# Patient Record
Sex: Male | Born: 1995 | Race: White | Hispanic: No | Marital: Single | Smoking: Current every day smoker
Health system: Southern US, Community
[De-identification: ages and names within clinical notes are randomized; demographics above are authoritative.]

## PROBLEM LIST (undated history)

## (undated) DIAGNOSIS — F909 Attention-deficit hyperactivity disorder, unspecified type: Secondary | ICD-10-CM

## (undated) DIAGNOSIS — F1910 Other psychoactive substance abuse, uncomplicated: Secondary | ICD-10-CM

## (undated) DIAGNOSIS — F1110 Opioid abuse, uncomplicated: Secondary | ICD-10-CM

## (undated) DIAGNOSIS — R1110 Vomiting, unspecified: Secondary | ICD-10-CM

## (undated) HISTORY — PX: WISDOM TOOTH EXTRACTION: SHX21

## (undated) HISTORY — DX: Attention-deficit hyperactivity disorder, unspecified type: F90.9

## (undated) HISTORY — DX: Vomiting, unspecified: R11.10

---
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
## 2013-01-30 ENCOUNTER — Telehealth: Payer: Self-pay | Admitting: Nurse Practitioner

## 2013-01-30 NOTE — Telephone Encounter (Signed)
appt made

## 2013-02-03 ENCOUNTER — Encounter: Payer: Self-pay | Admitting: Nurse Practitioner

## 2013-02-03 ENCOUNTER — Ambulatory Visit (INDEPENDENT_AMBULATORY_CARE_PROVIDER_SITE_OTHER): Payer: Managed Care, Other (non HMO) | Admitting: Nurse Practitioner

## 2013-02-03 VITALS — BP 133/78 | HR 75 | Temp 97.7°F | Ht 68.5 in | Wt 134.0 lb

## 2013-02-03 DIAGNOSIS — J309 Allergic rhinitis, unspecified: Secondary | ICD-10-CM

## 2013-02-03 DIAGNOSIS — F988 Other specified behavioral and emotional disorders with onset usually occurring in childhood and adolescence: Secondary | ICD-10-CM | POA: Insufficient documentation

## 2013-02-03 MED ORDER — MONTELUKAST SODIUM 10 MG PO TABS: 10.0000 mg | ORAL_TABLET | Freq: Every day | ORAL | Status: DC

## 2013-02-03 MED ORDER — LISDEXAMFETAMINE DIMESYLATE 50 MG PO CAPS: 50.0000 mg | ORAL_CAPSULE | ORAL | Status: DC

## 2013-02-03 NOTE — Progress Notes (Signed)
  Subjective:    Patient ID: Dennis Welch, male    DOB: 1996-05-26, 17 y.o.   MRN: [id number]  HPI Patient dx with ADD. Currently taking Vyvanse 50mg  QD. Patient states no side effects from medication. Behavior at school Good. Grades Good. Dont feel that need change in medications at this time.     Review of Systems  Gastrointestinal: Negative for nausea, diarrhea and constipation.  Psychiatric/Behavioral: Negative for decreased concentration. The patient is not hyperactive.   All other systems reviewed and are negative.       Objective:   Physical Exam  Constitutional: He is oriented to person, place, and time. He appears well-developed and well-nourished.  Cardiovascular: Normal rate, regular rhythm and normal heart sounds.   Pulmonary/Chest: Effort normal and breath sounds normal.  Neurological: He is alert and oriented to person, place, and time.  Psychiatric: He has a normal mood and affect. His behavior is normal. Judgment and thought content normal.   BP 133/78  Pulse 75  Temp(Src) 97.7 F (36.5 C) (Oral)  Ht 5' 8.5" (1.74 m)  Wt 134 lb (60.782 kg)  BMI 20.08 kg/m2        Assessment & Plan:  ADD  Continue to work on behavior  Eat 3 good meals a day  Continue current dose of vyvanse  F/U in 2-3 months Mary-Margaret Daphine Deutscher, FNP

## 2013-02-03 NOTE — Patient Instructions (Signed)
Allergic Rhinitis  Allergic rhinitis is when the mucous membranes in the nose respond to allergens. Allergens are particles in the air that cause your body to have an allergic reaction. This causes you to release allergic antibodies. Through a chain of events, these eventually cause you to release histamine into the blood stream (hence the use of antihistamines). Although meant to be protective to the body, it is this release that causes your discomfort, such as frequent sneezing, congestion and an itchy runny nose.    CAUSES    The pollen allergens may come from grasses, trees, and weeds. This is seasonal allergic rhinitis, or "hay fever." Other allergens cause year-round allergic rhinitis (perennial allergic rhinitis) such as house dust mite allergen, pet dander and mold spores.    SYMPTOMS     Nasal stuffiness (congestion).   Runny, itchy nose with sneezing and tearing of the eyes.   There is often an itching of the mouth, eyes and ears.  It cannot be cured, but it can be controlled with medications.  DIAGNOSIS    If you are unable to determine the offending allergen, skin or blood testing may find it.  TREATMENT     Avoid the allergen.   Medications and allergy shots (immunotherapy) can help.   Hay fever may often be treated with antihistamines in pill or nasal spray forms. Antihistamines block the effects of histamine. There are over-the-counter medicines that may help with nasal congestion and swelling around the eyes. Check with your caregiver before taking or giving this medicine.  If the treatment above does not work, there are many new medications your caregiver can prescribe. Stronger medications may be used if initial measures are ineffective. Desensitizing injections can be used if medications and avoidance fails. Desensitization is when a patient is given ongoing shots until the body becomes less sensitive to the allergen. Make sure you follow up with your caregiver if problems continue.   SEEK MEDICAL CARE IF:     You develop fever (more than 100.5 F (38.1 C).   You develop a cough that does not stop easily (persistent).   You have shortness of breath.   You start wheezing.   Symptoms interfere with normal daily activities.  Document Released: 07/11/2001 Document Revised: 01/08/2012 Document Reviewed: 01/20/2009  Exit[employer] Patient Information 2013 [employer].

## 2013-02-05 ENCOUNTER — Ambulatory Visit: Payer: Self-pay | Admitting: Nurse Practitioner

## 2013-04-07 ENCOUNTER — Telehealth: Payer: Self-pay | Admitting: Nurse Practitioner

## 2013-04-09 ENCOUNTER — Other Ambulatory Visit: Payer: Self-pay | Admitting: *Deleted

## 2013-04-09 MED ORDER — LISDEXAMFETAMINE DIMESYLATE 50 MG PO CAPS: ORAL_CAPSULE | ORAL | Status: DC

## 2013-04-09 NOTE — Telephone Encounter (Signed)
rx ready for pickup 

## 2013-04-09 NOTE — Telephone Encounter (Signed)
LAST RF 03/05/13. LAST OV 4/14.

## 2013-04-09 NOTE — Telephone Encounter (Signed)
Pt aware, rx ready. 

## 2013-04-29 ENCOUNTER — Telehealth: Payer: Self-pay | Admitting: Nurse Practitioner

## 2013-04-29 NOTE — Telephone Encounter (Signed)
appt changed

## 2013-05-08 ENCOUNTER — Ambulatory Visit (INDEPENDENT_AMBULATORY_CARE_PROVIDER_SITE_OTHER): Payer: Managed Care, Other (non HMO) | Admitting: Nurse Practitioner

## 2013-05-08 ENCOUNTER — Encounter: Payer: Self-pay | Admitting: Nurse Practitioner

## 2013-05-08 VITALS — BP 131/77 | HR 72 | Temp 97.4°F | Ht 68.0 in | Wt 131.0 lb

## 2013-05-08 DIAGNOSIS — F988 Other specified behavioral and emotional disorders with onset usually occurring in childhood and adolescence: Secondary | ICD-10-CM

## 2013-05-08 MED ORDER — LISDEXAMFETAMINE DIMESYLATE 50 MG PO CAPS: ORAL_CAPSULE | ORAL | Status: DC

## 2013-05-08 MED ORDER — LISDEXAMFETAMINE DIMESYLATE 50 MG PO CAPS: 50.0000 mg | ORAL_CAPSULE | ORAL | Status: DC

## 2013-05-08 NOTE — Progress Notes (Signed)
  Subjective:    Patient ID: Dennis Welch, male    DOB: 05-31-1996, 17 y.o.   MRN: [id number]  HPI Patient in for follow-up of ADD- Currently on vyvanse 50mg  1 po qd- Doing quite well- Behavior good- concentration good. No c/o side effects    Review of Systems  All other systems reviewed and are negative.       Objective:   Physical Exam  Constitutional: He appears well-developed and well-nourished.  Cardiovascular: Normal rate and normal heart sounds.   Pulmonary/Chest: Effort normal and breath sounds normal.  Psychiatric: He has a normal mood and affect. His behavior is normal. Judgment and thought content normal.    BP 131/77  Pulse 72  Temp(Src) 97.4 F (36.3 C) (Oral)  Ht 5\' 8"  (1.727 m)  Wt 131 lb (59.421 kg)  BMI 19.92 kg/m2        Assessment & Plan:  1. ADD (attention deficit disorder) Behavior modification Exercise Meds ordered this encounter  Medications  . lisdexamfetamine (VYVANSE) 50 MG capsule    Sig: Take 1 capsule (50 mg total) by mouth every morning.    Dispense:  30 capsule    Refill:  0    Order Specific Question:  Supervising Provider    Answer:  Ernestina Penna [[id number]]  . lisdexamfetamine (VYVANSE) 50 MG capsule    Sig: TAKE ONE CAPSULE BY MOUTH EVERY DAY    Dispense:  30 capsule    Refill:  0    DO Not FILL TILL 06/07/13    Order Specific Question:  Supervising Provider    Answer:  Ernestina Penna [[id number]]    Mary-Margaret Daphine Deutscher, FNP

## 2013-05-08 NOTE — Patient Instructions (Signed)
Attention Deficit Hyperactivity Disorder Attention deficit hyperactivity disorder (ADHD) is a problem with behavior issues based on the way the brain functions (neurobehavioral disorder). It is a common reason for behavior and academic problems in school. CAUSES  The cause of ADHD is unknown in most cases. It may run in families. It sometimes can be associated with learning disabilities and other behavioral problems. SYMPTOMS  There are 3 types of ADHD. The 3 types and some of the symptoms include:  Inattentive  Gets bored or distracted easily.  Loses or forgets things. Forgets to hand in homework.  Has trouble organizing or completing tasks.  Difficulty staying on task.  An inability to organize daily tasks and school work.  Leaving projects, chores, or homework unfinished.  Trouble paying attention or responding to details. Careless mistakes.  Difficulty following directions. Often seems like is not listening.  Dislikes activities that require sustained attention (like chores or homework).  Hyperactive-impulsive  Feels like it is impossible to sit still or stay in a seat. Fidgeting with hands and feet.  Trouble waiting turn.  Talking too much or out of turn. Interruptive.  Speaks or acts impulsively.  Aggressive, disruptive behavior.  Constantly busy or on the go, noisy.  Combined  Has symptoms of both of the above. Often children with ADHD feel discouraged about themselves and with school. They often perform well below their abilities in school. These symptoms can cause problems in home, school, and in relationships with peers. As children get older, the excess motor activities can calm down, but the problems with paying attention and staying organized persist. Most children do not outgrow ADHD but with good treatment can learn to cope with the symptoms. DIAGNOSIS  When ADHD is suspected, the diagnosis should be made by professionals trained in ADHD.  Diagnosis will  include:  Ruling out other reasons for the child's behavior.  The caregivers will check with the child's school and check their medical records.  They will talk to teachers and parents.  Behavior rating scales for the child will be filled out by those dealing with the child on a daily basis. A diagnosis is made only after all information has been considered. TREATMENT  Treatment usually includes behavioral treatment often along with medicines. It may include stimulant medicines. The stimulant medicines decrease impulsivity and hyperactivity and increase attention. Other medicines used include antidepressants and certain blood pressure medicines. Most experts agree that treatment for ADHD should address all aspects of the child's functioning. Treatment should not be limited to the use of medicines alone. Treatment should include structured classroom management. The parents must receive education to address rewarding good behavior, discipline, and limit-setting. Tutoring or behavioral therapy or both should be available for the child. If untreated, the disorder can have long-term serious effects into adolescence and adulthood. HOME CARE INSTRUCTIONS   Often with ADHD there is a lot of frustration among the family in dealing with the illness. There is often blame and anger that is not warranted. This is a life long illness. There is no way to prevent ADHD. In many cases, because the problem affects the family as a whole, the entire family may need help. A therapist can help the family find better ways to handle the disruptive behaviors and promote change. If the child is young, most of the therapist's work is with the parents. Parents will learn techniques for coping with and improving their child's behavior. Sometimes only the child with the ADHD needs counseling. Your caregivers can help   you make these decisions.  Children with ADHD may need help in organizing. Some helpful tips include:  Keep  routines the same every day from wake-up time to bedtime. Schedule everything. This includes homework and playtime. This should include outdoor and indoor recreation. Keep the schedule on the refrigerator or a bulletin board where it is frequently seen. Mark schedule changes as far in advance as possible.  Have a place for everything and keep everything in its place. This includes clothing, backpacks, and school supplies.  Encourage writing down assignments and bringing home needed books.  Offer your child a well-balanced diet. Breakfast is especially important for school performance. Children should avoid drinks with caffeine including:  Soft drinks.  Coffee.  Tea.  However, some older children (adolescents) may find these drinks helpful in improving their attention.  Children with ADHD need consistent rules that they can understand and follow. If rules are followed, give small rewards. Children with ADHD often receive, and expect, criticism. Look for good behavior and praise it. Set realistic goals. Give clear instructions. Look for activities that can foster success and self-esteem. Make time for pleasant activities with your child. Give lots of affection.  Parents are their children's greatest advocates. Learn as much as possible about ADHD. This helps you become a stronger and better advocate for your child. It also helps you educate your child's teachers and instructors if they feel inadequate in these areas. Parent support groups are often helpful. A national group with local chapters is called CHADD (Children and Adults with Attention Deficit Hyperactivity Disorder). PROGNOSIS  There is no cure for ADHD. Children with the disorder seldom outgrow it. Many find adaptive ways to accommodate the ADHD as they mature. SEEK MEDICAL CARE IF:  Your child has repeated muscle twitches, cough or speech outbursts.  Your child has sleep problems.  Your child has a marked loss of  appetite.  Your child develops depression.  Your child has new or worsening behavioral problems.  Your child develops dizziness.  Your child has a racing heart.  Your child has stomach pains.  Your child develops headaches. Document Released: 10/06/2002 Document Revised: 01/08/2012 Document Reviewed: 05/18/2008 Exit[employer] Patient Information 2014 [employer].  

## 2013-05-14 ENCOUNTER — Ambulatory Visit: Payer: Self-pay | Admitting: Nurse Practitioner

## 2013-07-04 ENCOUNTER — Telehealth: Payer: Self-pay | Admitting: Nurse Practitioner

## 2013-07-04 NOTE — Telephone Encounter (Signed)
appt made for monday

## 2013-07-10 ENCOUNTER — Ambulatory Visit: Payer: Managed Care, Other (non HMO) | Admitting: Family Medicine

## 2013-07-14 ENCOUNTER — Telehealth: Payer: Self-pay | Admitting: Nurse Practitioner

## 2013-07-15 MED ORDER — LISDEXAMFETAMINE DIMESYLATE 50 MG PO CAPS: 50.0000 mg | ORAL_CAPSULE | ORAL | Status: DC

## 2013-07-15 NOTE — Telephone Encounter (Signed)
r ready for pick up 

## 2013-07-16 NOTE — Telephone Encounter (Signed)
Up front 

## 2013-08-20 ENCOUNTER — Other Ambulatory Visit: Payer: Self-pay | Admitting: Nurse Practitioner

## 2013-08-21 MED ORDER — LISDEXAMFETAMINE DIMESYLATE 50 MG PO CAPS: 50.0000 mg | ORAL_CAPSULE | ORAL | Status: DC

## 2013-08-21 NOTE — Telephone Encounter (Signed)
rx ready for pickup 

## 2013-08-21 NOTE — Telephone Encounter (Signed)
Last filled 07/15/13, last seen 05/08/13

## 2013-08-22 NOTE — Telephone Encounter (Signed)
Called patient to pick up

## 2013-09-04 ENCOUNTER — Ambulatory Visit: Payer: Managed Care, Other (non HMO) | Admitting: Nurse Practitioner

## 2013-09-05 ENCOUNTER — Ambulatory Visit: Payer: Managed Care, Other (non HMO) | Admitting: Nurse Practitioner

## 2013-10-01 ENCOUNTER — Ambulatory Visit (INDEPENDENT_AMBULATORY_CARE_PROVIDER_SITE_OTHER): Payer: Managed Care, Other (non HMO) | Admitting: Nurse Practitioner

## 2013-10-01 ENCOUNTER — Other Ambulatory Visit: Payer: Self-pay | Admitting: Nurse Practitioner

## 2013-10-01 MED ORDER — LISDEXAMFETAMINE DIMESYLATE 50 MG PO CAPS: ORAL_CAPSULE | ORAL | Status: DC

## 2013-11-10 ENCOUNTER — Telehealth: Payer: Self-pay | Admitting: Nurse Practitioner

## 2013-11-10 ENCOUNTER — Ambulatory Visit: Payer: Managed Care, Other (non HMO) | Admitting: Nurse Practitioner

## 2013-11-10 NOTE — Telephone Encounter (Signed)
Appt changed to earlier in the day per mothers request

## 2013-11-11 ENCOUNTER — Telehealth: Payer: Self-pay | Admitting: Nurse Practitioner

## 2013-11-11 MED ORDER — LISDEXAMFETAMINE DIMESYLATE 50 MG PO CAPS: 50.0000 mg | ORAL_CAPSULE | ORAL | Status: DC

## 2013-11-11 NOTE — Telephone Encounter (Signed)
rx ready to be picked up

## 2013-11-12 ENCOUNTER — Ambulatory Visit: Payer: Managed Care, Other (non HMO) | Admitting: Nurse Practitioner

## 2013-11-12 NOTE — Telephone Encounter (Signed)
Rx for vyvance ready.

## 2013-12-02 ENCOUNTER — Ambulatory Visit: Payer: Managed Care, Other (non HMO) | Admitting: Nurse Practitioner

## 2013-12-18 ENCOUNTER — Telehealth: Payer: Self-pay | Admitting: Nurse Practitioner

## 2013-12-18 MED ORDER — LISDEXAMFETAMINE DIMESYLATE 50 MG PO CAPS: 50.0000 mg | ORAL_CAPSULE | ORAL | Status: DC

## 2013-12-18 NOTE — Telephone Encounter (Signed)
rx ready for pickup 

## 2013-12-19 NOTE — Telephone Encounter (Signed)
Patient aware.

## 2014-01-07 ENCOUNTER — Ambulatory Visit: Payer: Managed Care, Other (non HMO) | Admitting: Nurse Practitioner

## 2014-01-19 ENCOUNTER — Other Ambulatory Visit: Payer: Self-pay | Admitting: Nurse Practitioner

## 2014-01-19 MED ORDER — LISDEXAMFETAMINE DIMESYLATE 50 MG PO CAPS: 50.0000 mg | ORAL_CAPSULE | ORAL | Status: DC

## 2014-01-19 NOTE — Telephone Encounter (Signed)
rx ready for pickup 

## 2014-01-22 ENCOUNTER — Telehealth: Payer: Self-pay | Admitting: Nurse Practitioner

## 2014-01-22 MED ORDER — LISDEXAMFETAMINE DIMESYLATE 50 MG PO CAPS: ORAL_CAPSULE | ORAL | Status: DC

## 2014-01-22 NOTE — Telephone Encounter (Signed)
rx ready for pick up [facility] for future refills 

## 2014-01-22 NOTE — Telephone Encounter (Signed)
Mom aware to pick up rx

## 2014-02-18 ENCOUNTER — Ambulatory Visit: Payer: Managed Care, Other (non HMO) | Admitting: Nurse Practitioner

## 2014-09-14 ENCOUNTER — Ambulatory Visit (INDEPENDENT_AMBULATORY_CARE_PROVIDER_SITE_OTHER): Payer: BC Managed Care – PPO | Admitting: Nurse Practitioner

## 2014-09-14 ENCOUNTER — Encounter: Payer: Self-pay | Admitting: Nurse Practitioner

## 2014-09-14 ENCOUNTER — Ambulatory Visit (INDEPENDENT_AMBULATORY_CARE_PROVIDER_SITE_OTHER): Payer: BC Managed Care – PPO | Admitting: *Deleted

## 2014-09-14 VITALS — BP 156/94 | HR 108 | Temp 97.1°F | Ht 68.0 in | Wt 132.0 lb

## 2014-09-14 DIAGNOSIS — F988 Other specified behavioral and emotional disorders with onset usually occurring in childhood and adolescence: Secondary | ICD-10-CM

## 2014-09-14 DIAGNOSIS — F909 Attention-deficit hyperactivity disorder, unspecified type: Secondary | ICD-10-CM

## 2014-09-14 DIAGNOSIS — Z23 Encounter for immunization: Secondary | ICD-10-CM

## 2014-09-14 MED ORDER — LISDEXAMFETAMINE DIMESYLATE 50 MG PO CAPS: ORAL_CAPSULE | ORAL | Status: DC

## 2014-09-14 MED ORDER — LISDEXAMFETAMINE DIMESYLATE 50 MG PO CAPS: 50.0000 mg | ORAL_CAPSULE | ORAL | Status: DC

## 2014-09-14 NOTE — Patient Instructions (Signed)

## 2014-09-14 NOTE — Progress Notes (Signed)
  Subjective:    Patient ID: Dennis Welch, male    DOB: 04/17/1996, 18 y.o.   MRN: [id number]  HPI  Patient in for follow-up of ADD- Currently on vyvanse 50mg  1 po qd- Doing quite well- Behavior good- concentration good. No c/o side effects    Review of Systems  All other systems reviewed and are negative.      Objective:   Physical Exam  Constitutional: He is oriented to person, place, and time. He appears well-developed and well-nourished.  HENT:  Head: Normocephalic and atraumatic.  Right Ear: External ear normal.  Left Ear: External ear normal.  Bilateral ear moderate cerumen .   Eyes: Conjunctivae are normal. Pupils are equal, round, and reactive to light.  Neck: Normal range of motion.  Cardiovascular: Normal rate, regular rhythm, normal heart sounds and intact distal pulses.  Exam reveals no gallop and no friction rub.   No murmur heard. Pulmonary/Chest: Effort normal and breath sounds normal. No respiratory distress. He has no wheezes. He has no rales. He exhibits no tenderness.  Abdominal: Soft.  Musculoskeletal: Normal range of motion.  Neurological: He is alert and oriented to person, place, and time. He has normal reflexes.  Skin: Skin is warm.  Psychiatric: He has a normal mood and affect. His behavior is normal. Judgment and thought content normal.    BP 156/94 mmHg  Pulse 108  Temp(Src) 97.1 F (36.2 C) (Oral)  Ht 5\' 8"  (1.727 m)  Wt 132 lb (59.875 kg)  BMI 20.08 kg/m2        Assessment & Plan:   1. ADD (attention deficit disorder)    Meds ordered this encounter  Medications  . lisdexamfetamine (VYVANSE) 50 MG capsule    Sig: Take 1 capsule (50 mg total) by mouth every morning.    Dispense:  30 capsule    Refill:  0    Order Specific Question:  Supervising Provider    Answer:  Ernestina PennaMOORE, DONALD W [[id number]]  . lisdexamfetamine (VYVANSE) 50 MG capsule    Sig: TAKE ONE CAPSULE BY MOUTH EVERY DAY    Dispense:  30 capsule    Refill:  0    Do not fill  till 10/13/14    Order Specific Question:  Supervising Provider    Answer:  Ernestina PennaMOORE, DONALD W [[id number]]   Meds as prescribed Follow-up for recheck in 2 months  Dennis Daphine DeutscherMartin, FNP

## 2015-02-02 ENCOUNTER — Telehealth: Payer: Self-pay | Admitting: Nurse Practitioner

## 2015-02-02 DIAGNOSIS — F988 Other specified behavioral and emotional disorders with onset usually occurring in childhood and adolescence: Secondary | ICD-10-CM

## 2015-02-02 MED ORDER — LISDEXAMFETAMINE DIMESYLATE 50 MG PO CAPS: 50.0000 mg | ORAL_CAPSULE | ORAL | Status: DC

## 2015-02-02 NOTE — Telephone Encounter (Signed)
vyvanse rx ready for pick up  

## 2015-02-02 NOTE — Telephone Encounter (Signed)
Written Rx at front office to be picked up

## 2015-02-17 ENCOUNTER — Ambulatory Visit (INDEPENDENT_AMBULATORY_CARE_PROVIDER_SITE_OTHER): Payer: BLUE CROSS/BLUE SHIELD | Admitting: Family

## 2015-02-17 ENCOUNTER — Encounter: Payer: Self-pay | Admitting: Family

## 2015-02-17 VITALS — BP 144/87 | HR 96 | Temp 98.0°F | Ht 68.0 in | Wt 132.8 lb

## 2015-02-17 DIAGNOSIS — A084 Viral intestinal infection, unspecified: Secondary | ICD-10-CM

## 2015-02-17 DIAGNOSIS — R110 Nausea: Secondary | ICD-10-CM | POA: Diagnosis not present

## 2015-02-17 LAB — POCT INFLUENZA A/B
INFLUENZA A, POC: NEGATIVE
INFLUENZA B, POC: NEGATIVE

## 2015-02-17 NOTE — Progress Notes (Signed)
   Subjective:    Patient ID: Dennis Welch, male    DOB: 07/27/1996, 19 y.o.   MRN: [id number]  Emesis  This is a new problem. The current episode started in the past 7 days (Sunday). The problem occurs less than 2 times per day. The problem has been waxing and waning. The emesis has an appearance of stomach contents. Maximum temperature: Pt has been taking Motrin. Associated symptoms include chills, dizziness, headaches and myalgias. Pertinent negatives include no coughing or diarrhea. He has tried acetaminophen and bed rest for the symptoms. The treatment provided mild relief.  Headache  Associated symptoms include dizziness and vomiting. Pertinent negatives include no coughing.      Review of Systems  Constitutional: Positive for chills.  Respiratory: Negative.  Negative for cough.   Cardiovascular: Negative.   Gastrointestinal: Positive for vomiting. Negative for diarrhea.  Endocrine: Negative.   Genitourinary: Negative.   Musculoskeletal: Positive for myalgias.  Neurological: Positive for dizziness and headaches.  Hematological: Negative.   Psychiatric/Behavioral: Negative.   All other systems reviewed and are negative.      Objective:   Physical Exam  Constitutional: He is oriented to person, place, and time. He appears well-developed and well-nourished. No distress.  HENT:  Head: Normocephalic.  Right Ear: External ear normal.  Left Ear: External ear normal.  Mouth/Throat: Oropharynx is clear and moist.  Nasal passage erythemas with mild swelling    Eyes: Pupils are equal, round, and reactive to light. Right eye exhibits no discharge. Left eye exhibits no discharge.  Neck: Normal range of motion. Neck supple. No thyromegaly present.  Cardiovascular: Normal rate, regular rhythm, normal heart sounds and intact distal pulses.   No murmur heard. Pulmonary/Chest: Effort normal. No respiratory distress. He has wheezes.  Abdominal: Soft. Bowel sounds are normal. He  exhibits no distension. There is no tenderness.  Musculoskeletal: Normal range of motion. He exhibits no edema or tenderness.  Neurological: He is alert and oriented to person, place, and time. He has normal reflexes. No cranial nerve deficit.  Skin: Skin is warm and dry. No rash noted. No erythema.  Psychiatric: He has a normal mood and affect. His behavior is normal. Judgment and thought content normal.  Vitals reviewed.     BP 144/87 mmHg  Pulse 96  Temp(Src) 98 F (36.7 C) (Oral)  Ht 5\' 8"  (1.727 m)  Wt 132 lb 12.8 oz (60.238 kg)  BMI 20.20 kg/m2     Assessment & Plan:  1. Nausea - POCT Influenza A/B  2. Viral gastroenteritis -Force fluids -Bland diet -Rest -Tylenol prn for fever and aches -RTO prn  Jannifer Rodneyhristy Helem Reesor, FNP

## 2015-02-17 NOTE — Patient Instructions (Signed)

## 2015-02-22 ENCOUNTER — Encounter: Payer: Self-pay | Admitting: Nurse Practitioner

## 2015-02-22 ENCOUNTER — Ambulatory Visit (INDEPENDENT_AMBULATORY_CARE_PROVIDER_SITE_OTHER): Payer: BLUE CROSS/BLUE SHIELD | Admitting: Nurse Practitioner

## 2015-02-22 VITALS — BP 138/86 | HR 93 | Temp 98.6°F | Ht 68.0 in | Wt 132.0 lb

## 2015-02-22 DIAGNOSIS — F909 Attention-deficit hyperactivity disorder, unspecified type: Secondary | ICD-10-CM

## 2015-02-22 DIAGNOSIS — F988 Other specified behavioral and emotional disorders with onset usually occurring in childhood and adolescence: Secondary | ICD-10-CM

## 2015-02-22 MED ORDER — LISDEXAMFETAMINE DIMESYLATE 50 MG PO CAPS: ORAL_CAPSULE | ORAL | Status: DC

## 2015-02-22 MED ORDER — LISDEXAMFETAMINE DIMESYLATE 50 MG PO CAPS: 50.0000 mg | ORAL_CAPSULE | ORAL | Status: DC

## 2015-02-22 NOTE — Progress Notes (Signed)
   Subjective:    Patient ID: Dennis Welch, male    DOB: 04/05/1996, 19 y.o.   MRN: [id number]  HPI Patient brought in today by self for follow up of ADD. Currently taking vyvanse 50 mg daily. Behavior- good Grades- currently not i school Medication side effects- none Weight loss- none Sleeping habits- good Any concerns none     Review of Systems  Constitutional: Negative.   HENT: Negative.   Respiratory: Negative.   Cardiovascular: Negative.   Gastrointestinal: Negative.   Genitourinary: Negative.   Neurological: Negative.   Psychiatric/Behavioral: Negative.   All other systems reviewed and are negative.      Objective:   Physical Exam  Constitutional: He is oriented to person, place, and time. He appears well-developed and well-nourished.  Cardiovascular: Normal rate and regular rhythm.   Murmur (1/6 systolic) heard. Pulmonary/Chest: Effort normal and breath sounds normal.  Neurological: He is alert and oriented to person, place, and time.  Skin: Skin is warm and dry.  Psychiatric: He has a normal mood and affect. His behavior is normal. Judgment and thought content normal.    BP 138/86 mmHg  Pulse 93  Temp(Src) 98.6 F (37 C) (Oral)  Ht 5\' 8"  (1.727 m)  Wt 132 lb (59.875 kg)  BMI 20.08 kg/m2        Assessment & Plan:   1. ADD (attention deficit disorder)    Meds ordered this encounter  Medications  . lisdexamfetamine (VYVANSE) 50 MG capsule    Sig: Take 1 capsule (50 mg total) by mouth every morning.    Dispense:  30 capsule    Refill:  0    DO NOT FILL TILL 03/23/15    Order Specific Question:  Supervising Provider    Answer:  Ernestina PennaMOORE, DONALD W [[id number]]  . lisdexamfetamine (VYVANSE) 50 MG capsule    Sig: TAKE ONE CAPSULE BY MOUTH EVERY DAY    Dispense:  30 capsule    Refill:  0    Order Specific Question:  Supervising Provider    Answer:  Ernestina PennaMOORE, DONALD W [[id number]]  . lisdexamfetamine (VYVANSE) 50 MG capsule    Sig: Take 1 capsule (50 mg total) by  mouth every morning.    Dispense:  30 capsule    Refill:  0    DO NOT FILL TILL 6/23/ 16    Order Specific Question:  Supervising Provider    Answer:  Ernestina PennaMOORE, DONALD W [[id number]]   Stress management Follow up in 3 months  Dennis Daphine DeutscherMartin, FNP

## 2015-04-06 ENCOUNTER — Encounter: Payer: Self-pay | Admitting: Family

## 2015-04-06 ENCOUNTER — Ambulatory Visit (INDEPENDENT_AMBULATORY_CARE_PROVIDER_SITE_OTHER): Payer: BLUE CROSS/BLUE SHIELD | Admitting: Family

## 2015-04-06 VITALS — BP 137/81 | HR 88 | Temp 98.2°F | Ht 68.0 in | Wt 129.4 lb

## 2015-04-06 DIAGNOSIS — T148 Other injury of unspecified body region: Secondary | ICD-10-CM

## 2015-04-06 DIAGNOSIS — T148XXA Other injury of unspecified body region, initial encounter: Secondary | ICD-10-CM
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
## 2015-04-06 NOTE — Patient Instructions (Signed)

## 2015-05-31 ENCOUNTER — Telehealth: Payer: Self-pay | Admitting: Nurse Practitioner

## 2015-05-31 DIAGNOSIS — F988 Other specified behavioral and emotional disorders with onset usually occurring in childhood and adolescence: Secondary | ICD-10-CM

## 2015-05-31 MED ORDER — LISDEXAMFETAMINE DIMESYLATE 50 MG PO CAPS: 50.0000 mg | ORAL_CAPSULE | ORAL | Status: DC

## 2015-05-31 NOTE — Telephone Encounter (Signed)
No working number on file. Rx left up front and ready for pick up

## 2015-05-31 NOTE — Telephone Encounter (Signed)
vyvanse rx ready for pick up  

## 2015-06-18 ENCOUNTER — Ambulatory Visit: Payer: BLUE CROSS/BLUE SHIELD | Admitting: Nurse Practitioner

## 2015-07-23 ENCOUNTER — Telehealth: Payer: Self-pay | Admitting: Nurse Practitioner

## 2015-07-23 NOTE — Telephone Encounter (Signed)
Question answered. 

## 2015-07-24 NOTE — Telephone Encounter (Signed)
Pt was given an appt 08/02/15 with MMM.

## 2015-07-30 ENCOUNTER — Telehealth: Payer: Self-pay | Admitting: Nurse Practitioner

## 2015-08-02 ENCOUNTER — Ambulatory Visit: Payer: BLUE CROSS/BLUE SHIELD | Admitting: Nurse Practitioner

## 2015-08-02 ENCOUNTER — Encounter (HOSPITAL_COMMUNITY): Payer: Self-pay | Admitting: Emergency Medicine

## 2015-08-02 ENCOUNTER — Emergency Department (HOSPITAL_COMMUNITY): Payer: BLUE CROSS/BLUE SHIELD

## 2015-08-02 ENCOUNTER — Inpatient Hospital Stay (HOSPITAL_COMMUNITY)
Admission: EM | Admit: 2015-08-02 | Discharge: 2015-08-04 | DRG: 917 | Disposition: A | Discharge status: Home / Self Care | Payer: BLUE CROSS/BLUE SHIELD | Attending: Internal Medicine | Admitting: Internal Medicine

## 2015-08-02 ENCOUNTER — Ambulatory Visit: Payer: BLUE CROSS/BLUE SHIELD | Admitting: Family Medicine

## 2015-08-02 DIAGNOSIS — T50904A Poisoning by unspecified drugs, medicaments and biological substances, undetermined, initial encounter: Secondary | ICD-10-CM

## 2015-08-02 DIAGNOSIS — T401X1A Poisoning by heroin, accidental (unintentional), initial encounter: Principal | ICD-10-CM | POA: Diagnosis present

## 2015-08-02 DIAGNOSIS — Y92012 Bathroom of single-family (private) house as the place of occurrence of the external cause: Secondary | ICD-10-CM

## 2015-08-02 DIAGNOSIS — F988 Other specified behavioral and emotional disorders with onset usually occurring in childhood and adolescence: Secondary | ICD-10-CM | POA: Diagnosis present

## 2015-08-02 DIAGNOSIS — E876 Hypokalemia: Secondary | ICD-10-CM | POA: Diagnosis present

## 2015-08-02 DIAGNOSIS — Z781 Physical restraint status: Secondary | ICD-10-CM

## 2015-08-02 DIAGNOSIS — F1110 Opioid abuse, uncomplicated: Secondary | ICD-10-CM | POA: Diagnosis present

## 2015-08-02 DIAGNOSIS — F909 Attention-deficit hyperactivity disorder, unspecified type: Secondary | ICD-10-CM | POA: Diagnosis not present

## 2015-08-02 DIAGNOSIS — R4182 Altered mental status, unspecified: Secondary | ICD-10-CM | POA: Diagnosis not present

## 2015-08-02 DIAGNOSIS — T50901A Poisoning by unspecified drugs, medicaments and biological substances, accidental (unintentional), initial encounter: Secondary | ICD-10-CM | POA: Diagnosis present

## 2015-08-02 DIAGNOSIS — F901 Attention-deficit hyperactivity disorder, predominantly hyperactive type: Secondary | ICD-10-CM | POA: Diagnosis present

## 2015-08-02 DIAGNOSIS — J690 Pneumonitis due to inhalation of food and vomit: Secondary | ICD-10-CM

## 2015-08-02 DIAGNOSIS — T401X2D Poisoning by heroin, intentional self-harm, subsequent encounter: Secondary | ICD-10-CM

## 2015-08-02 DIAGNOSIS — R451 Restlessness and agitation: Secondary | ICD-10-CM | POA: Insufficient documentation

## 2015-08-02 LAB — CBC WITH DIFFERENTIAL/PLATELET
BASOS ABS: 0 10*3/uL (ref 0.0–0.1)
Basophils Relative: 0 %
Eosinophils Absolute: 0.2 10*3/uL (ref 0.0–0.7)
Eosinophils Relative: 1 %
HEMATOCRIT: 42.9 % (ref 39.0–52.0)
HEMOGLOBIN: 15.1 g/dL (ref 13.0–17.0)
Lymphocytes Relative: 7 %
Lymphs Abs: 1.3 10*3/uL (ref 0.7–4.0)
MCH: 31.6 pg (ref 26.0–34.0)
MCHC: 35.2 g/dL (ref 30.0–36.0)
MCV: 89.7 fL (ref 78.0–100.0)
MONO ABS: 1.2 10*3/uL — AB (ref 0.1–1.0)
MONOS PCT: 7 %
Neutro Abs: 16.1 10*3/uL — ABNORMAL HIGH (ref 1.7–7.7)
Neutrophils Relative %: 85 %
PLATELETS: 246 10*3/uL (ref 150–400)
RBC: 4.78 MIL/uL (ref 4.22–5.81)
RDW: 13.5 % (ref 11.5–15.5)
WBC: 18.8 10*3/uL — ABNORMAL HIGH (ref 4.0–10.5)

## 2015-08-02 LAB — BLOOD GAS, ARTERIAL
ACID-BASE EXCESS: 4.8 mmol/L — AB (ref 0.0–2.0)
BICARBONATE: 20.3 meq/L (ref 20.0–24.0)
Drawn by: 22223
O2 CONTENT: 4 L/min
O2 Saturation: 99.5 %
PATIENT TEMPERATURE: 37
PCO2 ART: 40.6 mmHg (ref 35.0–45.0)
PO2 ART: 209 mmHg — AB (ref 80.0–100.0)
TCO2: 19.4 mmol/L (ref 0–100)
pH, Arterial: 7.318 — ABNORMAL LOW (ref 7.350–7.450)

## 2015-08-02 LAB — URINALYSIS, ROUTINE W REFLEX MICROSCOPIC
Bilirubin Urine: NEGATIVE
Glucose, UA: NEGATIVE mg/dL
Hgb urine dipstick: NEGATIVE
KETONES UR: NEGATIVE mg/dL
LEUKOCYTES UA: NEGATIVE
NITRITE: NEGATIVE
PH: 6 (ref 5.0–8.0)
PROTEIN: NEGATIVE mg/dL
Specific Gravity, Urine: 1.025 (ref 1.005–1.030)
Urobilinogen, UA: 0.2 mg/dL (ref 0.0–1.0)

## 2015-08-02 LAB — RAPID URINE DRUG SCREEN, HOSP PERFORMED
AMPHETAMINES: NOT DETECTED
BARBITURATES: NOT DETECTED
BENZODIAZEPINES: POSITIVE — AB
Cocaine: NOT DETECTED
Opiates: POSITIVE — AB
TETRAHYDROCANNABINOL: POSITIVE — AB

## 2015-08-02 LAB — COMPREHENSIVE METABOLIC PANEL
ALBUMIN: 4.2 g/dL (ref 3.5–5.0)
ALK PHOS: 65 U/L (ref 38–126)
ALT: 19 U/L (ref 17–63)
ANION GAP: 14 (ref 5–15)
AST: 50 U/L — ABNORMAL HIGH (ref 15–41)
BILIRUBIN TOTAL: 0.4 mg/dL (ref 0.3–1.2)
BUN: 6 mg/dL (ref 6–20)
CALCIUM: 8.1 mg/dL — AB (ref 8.9–10.3)
CO2: 19 mmol/L — ABNORMAL LOW (ref 22–32)
CREATININE: 1 mg/dL (ref 0.61–1.24)
Chloride: 104 mmol/L (ref 101–111)
GFR calc Af Amer: >60 mL/min (ref >60)
GFR calc non Af Amer: >60 mL/min (ref >60)
GLUCOSE: 115 mg/dL — AB (ref 65–99)
Potassium: 3.2 mmol/L — ABNORMAL LOW (ref 3.5–5.1)
Sodium: 137 mmol/L (ref 135–145)
TOTAL PROTEIN: 7.4 g/dL (ref 6.5–8.1)

## 2015-08-02 LAB — PROCALCITONIN: Procalcitonin: <0.1 ng/mL

## 2015-08-02 LAB — ETHANOL: ALCOHOL ETHYL (B): 9 mg/dL — AB (ref <5)

## 2015-08-02 LAB — MAGNESIUM: Magnesium: 1.8 mg/dL (ref 1.7–2.4)

## 2015-08-02 LAB — I-STAT CG4 LACTIC ACID, ED: Lactic Acid, Venous: 5.51 mmol/L (ref 0.5–2.0)

## 2015-08-02 LAB — LACTIC ACID, PLASMA: LACTIC ACID, VENOUS: 4 mmol/L — AB (ref 0.5–2.0)

## 2015-08-02 LAB — LIPASE, BLOOD: Lipase: 27 U/L (ref 22–51)

## 2015-08-02 LAB — ACETAMINOPHEN LEVEL: Acetaminophen (Tylenol), Serum: <10 ug/mL — ABNORMAL LOW (ref 10–30)

## 2015-08-02 MED ORDER — SODIUM CHLORIDE 0.9 % IV BOLUS (SEPSIS)
1000.0000 mL | Freq: Once | INTRAVENOUS | Status: AC
  Administered 2015-08-02: 1000 mL via INTRAVENOUS

## 2015-08-02 MED ORDER — LORAZEPAM 2 MG/ML IJ SOLN
2.0000 mg | Freq: Once | INTRAMUSCULAR | Status: AC
  Administered 2015-08-02: 2 mg via INTRAVENOUS
  Filled 2015-08-02: qty 1

## 2015-08-02 MED ORDER — ONDANSETRON HCL 4 MG/2ML IJ SOLN
4.0000 mg | Freq: Once | INTRAMUSCULAR | Status: AC
  Administered 2015-08-02: 4 mg via INTRAVENOUS
  Filled 2015-08-02: qty 2

## 2015-08-02 MED ORDER — PIPERACILLIN-TAZOBACTAM 3.375 G IVPB
3.3750 g | Freq: Three times a day (TID) | INTRAVENOUS | Status: DC
  Administered 2015-08-03 – 2015-08-04 (×4): 3.375 g via INTRAVENOUS
  Filled 2015-08-02 (×8): qty 50

## 2015-08-02 MED ORDER — LORAZEPAM 2 MG/ML IJ SOLN
INTRAMUSCULAR | Status: AC
Start: 2015-08-02 — End: 2015-08-02
  Administered 2015-08-02: 1 mg
  Filled 2015-08-02: qty 1

## 2015-08-02 MED ORDER — VANCOMYCIN HCL IN DEXTROSE 1-5 GM/200ML-% IV SOLN
1000.0000 mg | Freq: Once | INTRAVENOUS | Status: AC
Start: 2015-08-02 — End: 2015-08-02
  Administered 2015-08-02: 1000 mg via INTRAVENOUS
  Filled 2015-08-02: qty 200

## 2015-08-02 MED ORDER — VANCOMYCIN HCL 500 MG IV SOLR
500.0000 mg | Freq: Three times a day (TID) | INTRAVENOUS | Status: DC
  Administered 2015-08-03 – 2015-08-04 (×4): 500 mg via INTRAVENOUS
  Filled 2015-08-02 (×8): qty 500

## 2015-08-02 MED ORDER — SODIUM CHLORIDE 0.9 % IV BOLUS (SEPSIS): 1000.0000 mL | INTRAVENOUS | Status: AC

## 2015-08-02 MED ORDER — NALOXONE HCL 1 MG/ML IJ SOLN
1.0000 mg/h | INTRAVENOUS | Status: DC
  Administered 2015-08-02: 1 mg/h via INTRAVENOUS
  Filled 2015-08-02: qty 4

## 2015-08-02 MED ORDER — SODIUM CHLORIDE 0.9 % IV SOLN: INTRAVENOUS | Status: DC

## 2015-08-02 MED ORDER — NALOXONE HCL 1 MG/ML IJ SOLN
INTRAMUSCULAR | Status: AC
  Filled 2015-08-02: qty 4

## 2015-08-02 MED ORDER — PIPERACILLIN-TAZOBACTAM 3.375 G IVPB 30 MIN
3.3750 g | Freq: Once | INTRAVENOUS | Status: AC
  Administered 2015-08-02: 3.375 g via INTRAVENOUS
  Filled 2015-08-02: qty 50
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
## 2015-08-02 NOTE — Progress Notes (Signed)
ANTIBIOTIC CONSULT NOTE - INITIAL  Pharmacy Consult for vancomycin and zosyn Indication: rule out sepsis  Allergies  Allergen Reactions  . Sulfa Antibiotics     Patient Measurements: Height:  (172.7 cm) Weight: 129 lb (58.514 kg) IBW/kg (Calculated) : 68.4   Vital Signs: BP: 141/91 mmHg (10/03 2000) Pulse Rate: 104 (10/03 2000) Intake/Output from previous day:   Intake/Output from this shift:    Labs:  Recent Labs  08/02/15 2018  WBC 18.8*  HGB 15.1  PLT 246  CREATININE 1.00   Estimated Creatinine Clearance: 96.7 mL/min (by C-G formula based on Cr of 1). No results for input(s): VANCOTROUGH, VANCOPEAK, VANCORANDOM, GENTTROUGH, GENTPEAK, GENTRANDOM, TOBRATROUGH, TOBRAPEAK, TOBRARND, AMIKACINPEAK, AMIKACINTROU, AMIKACIN in the last 72 hours.   Microbiology: No results found for this or any previous visit (from the past 720 hour(s)).  Medical History: Past Medical History  Diagnosis Date  . ADD (attention deficit disorder with hyperactivity)   . Vomiting     Medications:  See medication history Assessment: 19 yo man found unconscious by mother.  He will start broad spectrum antibiotics for r/o sepsis, r/o asp PNA.  First doses of antibiotics ordered in the ED    Goal of Therapy:  Vancomycin trough level 15-20 mcg/ml  Plan:  Cont zosyn 3.375 gm IV q8 hours Cont vancomycin 500 mg IV q8 hours F/u renal function, cultures and clinical course Check vanc trough at [facility]  Thanks for allowing pharmacy to be a part of this patient's care.  Talbert Cage, PharmD Clinical Pharmacist  08/02/2015,9:18 PM

## 2015-08-02 NOTE — ED Notes (Signed)
CRITICAL VALUE ALERT  Critical value received:  Lactic acid 4.0  Date of notification:  08/02/2015  Time of notification:  2205  Critical value read back:Yes.    Nurse who received alert:  Bronson Curb, RN  MD notified (1st page):  Dr Deretha Emory  Time of first page:  2205  MD notified (2nd page):  Time of second page:  Responding MD:  Dr Deretha Emory  Time MD responded:  2205

## 2015-08-02 NOTE — ED Notes (Signed)
Patient cursing at staff  And attempting to get out of bed

## 2015-08-02 NOTE — ED Provider Notes (Signed)
CSN: [id number]     Arrival date & time 08/02/15  1948 History   First MD Initiated Contact with Patient 08/02/15 1950     Chief Complaint  Patient presents with  . Drug Overdose     (Consider location/radiation/quality/duration/timing/severity/associated sxs/prior Treatment) Patient is a 19 y.o. male presenting with Overdose. The history is provided by the EMS personnel. The history is limited by the condition of the patient.  Drug Overdose   level V caveat applies to the history due to the patient's altered mental status.  EMS called to the house by mother who found her son passed out in the bathroom floor with a used needle and a bag of white substance present. EMS gave patient 4 mg of Narcan with some improvement in his breathing however mental status and not returned to normal but patient did become more alert. Patient has a long history of heroin abuse.  Past Medical History  Diagnosis Date  . ADD (attention deficit disorder with hyperactivity)   . Vomiting    Past Surgical History  Procedure Laterality Date  . Wisdom tooth extraction     Family History  Problem Relation Age of Onset  . Healthy Mother   . Healthy Father   . Healthy Brother    Social History  Substance Use Topics  . Smoking status: Never Smoker   . Smokeless tobacco: None  . Alcohol Use: No    Review of Systems  Unable to perform ROS  level V caveat applies the review of systems due to the patient's altered mental status.    Allergies  Sulfa antibiotics  Home Medications   Prior to Admission medications   Medication Sig Start Date End Date Taking? Authorizing Provider  gabapentin (NEURONTIN) 600 MG tablet  01/02/15   Historical Provider, MD  lisdexamfetamine (VYVANSE) 50 MG capsule TAKE ONE CAPSULE BY MOUTH EVERY DAY 02/22/15   Mary-Margaret Daphine Deutscher, FNP  lisdexamfetamine (VYVANSE) 50 MG capsule Take 1 capsule (50 mg total) by mouth every morning. 05/31/15   Mary-Margaret Daphine Deutscher, FNP   methocarbamol (ROBAXIN) 750 MG tablet  01/02/15   Historical Provider, MD  mirtazapine (REMERON) 15 MG tablet  06/24/15   Historical Provider, MD  montelukast (SINGULAIR) 10 MG tablet Take 1 tablet (10 mg total) by mouth at bedtime. 02/03/13   Mary-Margaret Daphine Deutscher, FNP  QUEtiapine (SEROQUEL) 100 MG tablet  06/24/15   Historical Provider, MD  rOPINIRole (REQUIP) 0.5 MG tablet  06/22/15   Historical Provider, MD  traZODone (DESYREL) 100 MG tablet  06/18/15   Historical Provider, MD   BP 141/91 mmHg  Pulse 104  Resp 18  Ht  (1.727 m)  Wt 129 lb (58.514 kg)  BMI 19.62 kg/m2  SpO2 100% Physical Exam  Constitutional: He appears well-developed and well-nourished. He appears distressed.  HENT:  Head: Normocephalic and atraumatic.  Eyes: Conjunctivae are normal.  He was or large. Equal bilaterally. Reactive.  Neck: Neck supple.  Cardiovascular: Normal rate, regular rhythm and normal heart sounds.   No murmur heard. Tachycardic  Pulmonary/Chest: Effort normal and breath sounds normal. No respiratory distress. He has no wheezes. He has no rales.  Abdominal: Soft. Bowel sounds are normal. There is no tenderness.  Musculoskeletal: Normal range of motion. He exhibits no edema.  Neurological:  Moving all 4 extremities. Awake not following commands. Not verbal.  Skin: Skin is warm. No rash noted.  Nursing note and vitals reviewed.   ED Course  Procedures (including critical care time) Labs Review Labs  Reviewed  COMPREHENSIVE METABOLIC PANEL - Abnormal; Notable for the following:    Potassium 3.2 (*)    CO2 19 (*)    Glucose, Bld 115 (*)    Calcium 8.1 (*)    AST 50 (*)    All other components within normal limits  CBC WITH DIFFERENTIAL/PLATELET - Abnormal; Notable for the following:    WBC 18.8 (*)    Neutro Abs 16.1 (*)    Monocytes Absolute 1.2 (*)    All other components within normal limits  URINE RAPID DRUG SCREEN, HOSP PERFORMED - Abnormal; Notable for the following:    Opiates  POSITIVE (*)    Benzodiazepines POSITIVE (*)    Tetrahydrocannabinol POSITIVE (*)    All other components within normal limits  ETHANOL - Abnormal; Notable for the following:    Alcohol, Ethyl (B) 9 (*)    All other components within normal limits  ACETAMINOPHEN LEVEL - Abnormal; Notable for the following:    Acetaminophen (Tylenol), Serum <10 (*)    All other components within normal limits  BLOOD GAS, ARTERIAL - Abnormal; Notable for the following:    pH, Arterial 7.318 (*)    pO2, Arterial 209 (*)    Acid-Base Excess 4.8 (*)    All other components within normal limits  CULTURE, BLOOD (ROUTINE X 2)  CULTURE, BLOOD (ROUTINE X 2)  URINE CULTURE  LIPASE, BLOOD  URINALYSIS, ROUTINE W REFLEX MICROSCOPIC (NOT AT [facility])  I-STAT CG4 LACTIC ACID, ED   Results for orders placed or performed during the hospital encounter of 08/02/15  Comprehensive metabolic panel  Result Value Ref Range   Sodium 137 135 - 145 mmol/L   Potassium 3.2 (L) 3.5 - 5.1 mmol/L   Chloride 104 101 - 111 mmol/L   CO2 19 (L) 22 - 32 mmol/L   Glucose, Bld 115 (H) 65 - 99 mg/dL   BUN 6 6 - 20 mg/dL   Creatinine, Ser 9.60 0.61 - 1.24 mg/dL   Calcium 8.1 (L) 8.9 - 10.3 mg/dL   Total Protein 7.4 6.5 - 8.1 g/dL   Albumin 4.2 3.5 - 5.0 g/dL   AST 50 (H) 15 - 41 U/L   ALT 19 17 - 63 U/L   Alkaline Phosphatase 65 38 - 126 U/L   Total Bilirubin 0.4 0.3 - 1.2 mg/dL   GFR calc non Af Amer >60 >60 mL/min   GFR calc Af Amer >60 >60 mL/min   Anion gap 14 5 - 15  Lipase, blood  Result Value Ref Range   Lipase 27 22 - 51 U/L  CBC with Differential/Platelet  Result Value Ref Range   WBC 18.8 (H) 4.0 - 10.5 K/uL   RBC 4.78 4.22 - 5.81 MIL/uL   Hemoglobin 15.1 13.0 - 17.0 g/dL   HCT 45.4 09.8 - 11.9 %   MCV 89.7 78.0 - 100.0 fL   MCH 31.6 26.0 - 34.0 pg   MCHC 35.2 30.0 - 36.0 g/dL   RDW 14.7 82.9 - 56.2 %   Platelets 246 150 - 400 K/uL   Neutrophils Relative % 85 %   Neutro Abs 16.1 (H) 1.7 - 7.7 K/uL   Lymphocytes  Relative 7 %   Lymphs Abs 1.3 0.7 - 4.0 K/uL   Monocytes Relative 7 %   Monocytes Absolute 1.2 (H) 0.1 - 1.0 K/uL   Eosinophils Relative 1 %   Eosinophils Absolute 0.2 0.0 - 0.7 K/uL   Basophils Relative 0 %   Basophils Absolute 0.0  0.0 - 0.1 K/uL  Urine rapid drug screen (hosp performed)  Result Value Ref Range   Opiates POSITIVE (A) NONE DETECTED   Cocaine NONE DETECTED NONE DETECTED   Benzodiazepines POSITIVE (A) NONE DETECTED   Amphetamines NONE DETECTED NONE DETECTED   Tetrahydrocannabinol POSITIVE (A) NONE DETECTED   Barbiturates NONE DETECTED NONE DETECTED  Ethanol  Result Value Ref Range   Alcohol, Ethyl (B) 9 (H) <5 mg/dL  Urinalysis, Routine w reflex microscopic (not at [facility])  Result Value Ref Range   Color, Urine YELLOW YELLOW   APPearance CLEAR CLEAR   Specific Gravity, Urine 1.025 1.005 - 1.030   pH 6.0 5.0 - 8.0   Glucose, UA NEGATIVE NEGATIVE mg/dL   Hgb urine dipstick NEGATIVE NEGATIVE   Bilirubin Urine NEGATIVE NEGATIVE   Ketones, ur NEGATIVE NEGATIVE mg/dL   Protein, ur NEGATIVE NEGATIVE mg/dL   Urobilinogen, UA 0.2 0.0 - 1.0 mg/dL   Nitrite NEGATIVE NEGATIVE   Leukocytes, UA NEGATIVE NEGATIVE  Acetaminophen level  Result Value Ref Range   Acetaminophen (Tylenol), Serum <10 (L) 10 - 30 ug/mL  Blood gas, arterial ([facility] & [facility] ONLY)  Result Value Ref Range   O2 Content 4.0 L/min   Delivery systems NASAL CANNULA    pH, Arterial 7.318 (L) 7.350 - 7.450   pCO2 arterial 40.6 35.0 - 45.0 mmHg   pO2, Arterial 209 (H) 80.0 - 100.0 mmHg   Bicarbonate 20.3 20.0 - 24.0 mEq/L   TCO2 19.4 0 - 100 mmol/L   Acid-Base Excess 4.8 (H) 0.0 - 2.0 mmol/L   O2 Saturation 99.5 %   Patient temperature 37.0    Collection site RIGHT RADIAL    Drawn by [id number]    Sample type ARTERIAL    Allens test (pass/fail) PASS PASS     Imaging Review Dg Chest Port 1 View  08/02/2015   CLINICAL DATA:  Found unconscious with suspected overdose.  EXAM: PORTABLE CHEST 1 VIEW  COMPARISON:   None.  FINDINGS: Cardiac silhouette is normal. There is soft tissue thickening in the right peritracheal location, obscuring the right mediastinal border. Diffuse haziness of the right lung is also seen. The left lung is clear.  There is no evidence of pneumothorax.  Osseous structures are without acute abnormality. Soft tissues are grossly normal.  IMPRESSION: Soft tissue thickening in right peritracheal location, with apparent airspace consolidation of the right lung with upper lobe predominance. Findings may represent aspiration pneumonia, however mediastinal mass cannot be excluded. Follow-up to resolution is recommended.   Electronically Signed   By: Ted Mcalpine M.D.   On: 08/02/2015 20:54   I have personally reviewed and evaluated these images and lab results as part of my medical decision-making.   EKG Interpretation   Date/Time:  Monday August 02 2015 19:54:41 EDT Ventricular Rate:  113 PR Interval:  132 QRS Duration: 86 QT Interval:  306 QTC Calculation: 419 R Axis:   74 Text Interpretation:  Sinus tachycardia Nonspecific T abnormalities,  diffuse leads No previous ECGs available Confirmed by Cheryl Stabenow  MD, Jaan Fischel  [phone]) on 08/02/2015 8:34:58 PM      CRITICAL CARE Performed by: Vanetta Mulders Total critical care time: 30 Critical care time was exclusive of separately billable procedures and treating other patients. Critical care was necessary to treat or prevent imminent or life-threatening deterioration. Critical care was time spent personally by me on the following activities: development of treatment plan with patient and/or surrogate as well as nursing, discussions with consultants, evaluation of patient's  response to treatment, examination of patient, obtaining history from patient or surrogate, ordering and performing treatments and interventions, ordering and review of laboratory studies, ordering and review of radiographic studies, pulse oximetry and re-evaluation  of patient's condition.    MDM   Final diagnoses:  Overdose, undetermined intent, initial encounter  Aspiration pneumonia of right upper lobe, unspecified aspiration pneumonia type [facility])    Patient brought in by EMS following heroin overdose. In discussion with patient's mother may have also had benzos onboard. Patient upon arrival had been given for grams of Narcan by EMS was awake but was not oriented not following commands. Patient started on Narcan drip. Patient on 2 L of oxygen 100% room air seen to be protecting his airway. Patient's blood gas showed adequate PCO2 and PO2. Mother raise concerns about him having a cough for the past 2 days. Patient's mother was thinking about having him evaluated by a doctor for this. Does not have a history of seasonal allergies. Patient's mother made it quite clear that she heard him collapse in the bathroom and it was only 2 minutes before she was in there.   On the Narcan drip patient became more alert and talking some necessarily appropriately but talking not following commands very well bit agitated. She ripped out his IVs. Patient placed in restraints. Although he does have some benzos onboard will give some Ativan to Calm him down to try to get the IVs restarted.   Chest x-ray raises concern for right-sided aspiration pneumonia. Therefore a septic protocol orders were placed and he was started on broad-spectrum antibodies. These may need to be fine tuned to fit more of an aspiration pneumonia situation. Patient was tachycardic did have evidence of a fever here 100.4. Never was hypotensive. Blood cultures were done urine culture done patient started on antibodies. Patient to get 2 L of normal saline IV bolus.  Patient does not require intubation at this time. Patient's been admitted to the ICU.     Vanetta Mulders, MD 08/02/15 2148

## 2015-08-02 NOTE — H&P (Signed)
PCP:   Bennie Pierini, FNP   Chief Complaint:  Drug overdose, found down  HPI: 19 yo male with long standing h/o heroin abuse comes in after mom heard him fall in the bathroom she found him on the floor unresponsive with a needle next to him in the bathroom.  EMS was called ,given a dose of narcan and had good response.  Mother reported to ED earlier that he has been coughing for several days.  Review of Systems:  unobtainalbe due to pt overdose  Past Medical History: Past Medical History  Diagnosis Date  . ADD (attention deficit disorder with hyperactivity)   . Vomiting    Past Surgical History  Procedure Laterality Date  . Wisdom tooth extraction      Medications: Prior to Admission medications   Medication Sig Start Date End Date Taking? Authorizing Provider  lisdexamfetamine (VYVANSE) 50 MG capsule Take 1 capsule (50 mg total) by mouth every morning. Patient not taking: Reported on 08/02/2015 05/31/15   Mary-Margaret Daphine Deutscher, FNP    Allergies:   Allergies  Allergen Reactions  . Sulfa Antibiotics     Social History:  reports that he has never smoked. He does not have any smokeless tobacco history on file. He reports that he uses illicit drugs. He reports that he does not drink alcohol.  Family History: Family History  Problem Relation Age of Onset  . Healthy Mother   . Healthy Father   . Healthy Brother     Physical Exam: Filed Vitals:   08/02/15 2000 08/02/15 2130 08/02/15 2200 08/02/15 2230  BP: 141/91 125/75 129/81 121/81  Pulse: 104 95  94  Resp: Height:      Weight:      SpO2: 100% 100%  100%   General appearance: alert, cooperative and no distress Head: Normocephalic, without obvious abnormality, atraumatic Eyes: negative Nose: Nares normal. Septum midline. Mucosa normal. No drainage or sinus tenderness. Neck: no JVD and supple, symmetrical, trachea midline Lungs: clear to auscultation bilaterally Heart: regular rate and rhythm,  S1, S2 normal, no murmur, click, rub or gallop Abdomen: soft, non-tender; bowel sounds normal; no masses,  no organomegaly Extremities: extremities normal, atraumatic, no cyanosis or edema Pulses: 2+ and symmetric Skin: Skin color, texture, turgor normal. No rashes or lesions Neurologic: Grossly normal  Sedated, but responsive protecting own airway follows simple commands  Labs on Admission:   Recent Labs  08/02/15 2018  NA 137  K 3.2*  CL 104  CO2 19*  GLUCOSE 115*  BUN 6  CREATININE 1.00  CALCIUM 8.1*    Recent Labs  08/02/15 2018  AST 50*  ALT 19  ALKPHOS 65  BILITOT 0.4  PROT 7.4  ALBUMIN 4.2    Recent Labs  08/02/15 2018  LIPASE 27    Recent Labs  08/02/15 2018  WBC 18.8*  NEUTROABS 16.1*  HGB 15.1  HCT 42.9  MCV 89.7  PLT 246   Radiological Exams on Admission: Dg Chest Port 1 View  08/02/2015   CLINICAL DATA:  Found unconscious with suspected overdose.  EXAM: PORTABLE CHEST 1 VIEW  COMPARISON:  None.  FINDINGS: Cardiac silhouette is normal. There is soft tissue thickening in the right peritracheal location, obscuring the right mediastinal border. Diffuse haziness of the right lung is also seen. The left lung is clear.  There is no evidence of pneumothorax.  Osseous structures are without acute abnormality. Soft tissues are grossly normal.  IMPRESSION: Soft tissue thickening in  right peritracheal location, with apparent airspace consolidation of the right lung with upper lobe predominance. Findings may represent aspiration pneumonia, however mediastinal mass cannot be excluded. Follow-up to resolution is recommended.   Electronically Signed   By: Ted Mcalpine M.D.   On: 08/02/2015 20:54   Case discussed with dr Milford Cage in ed cxr reviewed consolidation in rul ekg reviewed sinus tachycardia no acute issues  Assessment/Plan  19 yo male with heroin overdose  Principal Problem:   Heroin overdose-  Was on narcan gtt in the ED, but became very  agitated needing several doses of ativan and soft restraints.  Give haldol dose prn.  Prn narcan ordered for respiratory compromise.  Pt more responsive now, denies this was suicide attempt but does not recall much from earlier.  Active Problems:   ADD (attention deficit disorder)- noted   Aspiration pneumonia (HCC)-  Place on iv vanc and zosyn per pharm dosing.  Blood cx pending.  Lactic acid level elevated, procalcitonin nml.  Likely due to combination of drug overdose/infection.  ivf and q 3 hour lactic acid level until normalizes.  Has received 3 liters ivf bolus thus far   Hypokalemia -  Replete, check mag level.  Obs in icu.  Full code.  Zeda Gangwer A 08/02/2015, 10:45 PM

## 2015-08-03 ENCOUNTER — Encounter (HOSPITAL_COMMUNITY): Payer: Self-pay

## 2015-08-03 DIAGNOSIS — J690 Pneumonitis due to inhalation of food and vomit: Secondary | ICD-10-CM | POA: Diagnosis present

## 2015-08-03 DIAGNOSIS — F909 Attention-deficit hyperactivity disorder, unspecified type: Secondary | ICD-10-CM | POA: Diagnosis not present

## 2015-08-03 DIAGNOSIS — T401X1D Poisoning by heroin, accidental (unintentional), subsequent encounter: Secondary | ICD-10-CM | POA: Diagnosis not present

## 2015-08-03 DIAGNOSIS — R451 Restlessness and agitation: Secondary | ICD-10-CM | POA: Insufficient documentation

## 2015-08-03 DIAGNOSIS — T401X4D Poisoning by heroin, undetermined, subsequent encounter: Secondary | ICD-10-CM

## 2015-08-03 DIAGNOSIS — F1110 Opioid abuse, uncomplicated: Secondary | ICD-10-CM | POA: Diagnosis present

## 2015-08-03 DIAGNOSIS — F901 Attention-deficit hyperactivity disorder, predominantly hyperactive type: Secondary | ICD-10-CM | POA: Diagnosis present

## 2015-08-03 DIAGNOSIS — R4182 Altered mental status, unspecified: Secondary | ICD-10-CM | POA: Diagnosis present

## 2015-08-03 DIAGNOSIS — T401X1A Poisoning by heroin, accidental (unintentional), initial encounter: Secondary | ICD-10-CM | POA: Diagnosis present

## 2015-08-03 DIAGNOSIS — E876 Hypokalemia: Secondary | ICD-10-CM | POA: Diagnosis present

## 2015-08-03 DIAGNOSIS — Z781 Physical restraint status: Secondary | ICD-10-CM | POA: Diagnosis not present

## 2015-08-03 DIAGNOSIS — Y92012 Bathroom of single-family (private) house as the place of occurrence of the external cause: Secondary | ICD-10-CM | POA: Diagnosis not present

## 2015-08-03 LAB — BASIC METABOLIC PANEL
ANION GAP: 6 (ref 5–15)
BUN: <5 mg/dL — ABNORMAL LOW (ref 6–20)
CALCIUM: 8 mg/dL — AB (ref 8.9–10.3)
CO2: 23 mmol/L (ref 22–32)
Chloride: 108 mmol/L (ref 101–111)
Creatinine, Ser: 0.72 mg/dL (ref 0.61–1.24)
GFR calc Af Amer: >60 mL/min (ref >60)
GFR calc non Af Amer: >60 mL/min (ref >60)
GLUCOSE: 91 mg/dL (ref 65–99)
Potassium: 3.9 mmol/L (ref 3.5–5.1)
Sodium: 137 mmol/L (ref 135–145)

## 2015-08-03 LAB — LACTIC ACID, PLASMA: Lactic Acid, Venous: 0.8 mmol/L (ref 0.5–2.0)

## 2015-08-03 LAB — CBC
HCT: 40.3 % (ref 39.0–52.0)
HEMOGLOBIN: 14.1 g/dL (ref 13.0–17.0)
MCH: 31.3 pg (ref 26.0–34.0)
MCHC: 35 g/dL (ref 30.0–36.0)
MCV: 89.4 fL (ref 78.0–100.0)
Platelets: 214 10*3/uL (ref 150–400)
RBC: 4.51 MIL/uL (ref 4.22–5.81)
RDW: 13.8 % (ref 11.5–15.5)
WBC: 9.8 10*3/uL (ref 4.0–10.5)

## 2015-08-03 LAB — MRSA PCR SCREENING: MRSA BY PCR: NEGATIVE

## 2015-08-03 MED ORDER — POTASSIUM CHLORIDE 10 MEQ/100ML IV SOLN
10.0000 meq | INTRAVENOUS | Status: AC
  Administered 2015-08-03 (×4): 10 meq via INTRAVENOUS
  Filled 2015-08-03 (×3): qty 100

## 2015-08-03 MED ORDER — HALOPERIDOL LACTATE 5 MG/ML IJ SOLN
2.0000 mg | Freq: Once | INTRAMUSCULAR | Status: DC
  Filled 2015-08-03: qty 1

## 2015-08-03 MED ORDER — HALOPERIDOL LACTATE 5 MG/ML IJ SOLN: 2.0000 mg | Freq: Four times a day (QID) | INTRAMUSCULAR | Status: DC | PRN

## 2015-08-03 MED ORDER — CLONIDINE HCL 0.1 MG PO TABS
0.1000 mg | ORAL_TABLET | Freq: Three times a day (TID) | ORAL | Status: DC
  Administered 2015-08-03 – 2015-08-04 (×3): 0.1 mg via ORAL
  Filled 2015-08-03 (×3): qty 1

## 2015-08-03 MED ORDER — ONDANSETRON HCL 4 MG PO TABS: 4.0000 mg | ORAL_TABLET | Freq: Four times a day (QID) | ORAL | Status: DC | PRN

## 2015-08-03 MED ORDER — SODIUM CHLORIDE 0.9 % IV SOLN
INTRAVENOUS | Status: DC
  Administered 2015-08-03 – 2015-08-04 (×3): via INTRAVENOUS

## 2015-08-03 MED ORDER — NALOXONE HCL 0.4 MG/ML IJ SOLN: 0.4000 mg | INTRAMUSCULAR | Status: DC | PRN

## 2015-08-03 MED ORDER — ONDANSETRON HCL 4 MG/2ML IJ SOLN
4.0000 mg | Freq: Four times a day (QID) | INTRAMUSCULAR | Status: DC | PRN
Start: 2015-08-03 — End: 2015-08-04

## 2015-08-03 MED ORDER — PIPERACILLIN-TAZOBACTAM 3.375 G IVPB
INTRAVENOUS | Status: AC
  Filled 2015-08-03: qty 50

## 2015-08-03 MED ORDER — LORAZEPAM 2 MG/ML IJ SOLN
1.0000 mg | Freq: Four times a day (QID) | INTRAMUSCULAR | Status: DC | PRN
  Administered 2015-08-03: 1 mg via INTRAVENOUS
  Filled 2015-08-03: qty 1

## 2015-08-03 MED ORDER — ENOXAPARIN SODIUM 40 MG/0.4ML ~~LOC~~ SOLN
40.0000 mg | SUBCUTANEOUS | Status: DC
  Administered 2015-08-03 (×2): 40 mg via SUBCUTANEOUS
  Filled 2015-08-03 (×2): qty 0.4

## 2015-08-03 MED ORDER — VANCOMYCIN HCL 500 MG IV SOLR
INTRAVENOUS | Status: AC
  Filled 2015-08-03: qty 500

## 2015-08-03 MED ORDER — POTASSIUM CHLORIDE 10 MEQ/100ML IV SOLN
INTRAVENOUS | Status: AC
  Administered 2015-08-03: 10 meq
  Filled 2015-08-03: qty 100

## 2015-08-03 NOTE — Progress Notes (Signed)
PT HAS BECOME HOSTILE, COMBATIVE , AND BELIGERANT.Dennis Welch RESTRAINTS REAPPLIED . MOTHER IN ROOM, BUT LEFT WHEN PT BEGAN PULLING ON LINE AND CRUSSING.Dennis Welch

## 2015-08-03 NOTE — Progress Notes (Addendum)
Pt mother stated that patient "just left a drug rehab treatment" and was also "being treated for pneumonia" prior to ER visit of 08/02/15 . Clarification ... Spoke with mother again this AM ..Mother had made appointment for him to go to Physician as he had a bad cough and she thought it might be pneumonia (prior to this admission)    9

## 2015-08-03 NOTE — Progress Notes (Signed)
PT IS NOW CALM AND COOPERATIVE. BILATERAL WRIST RESTRAINTS HAVE BEEN DISCONTINUED.

## 2015-08-03 NOTE — Care Management Note (Signed)
Case Management Note  Patient Details  Name: MARTAVIUS LUSTY MRN: [id number] Date of Birth: 04/20/1996  Expected Discharge Date:     08/05/2015             Expected Discharge Plan:  Home/Self Care  In-House Referral:  Clinical Social Work  Discharge planning Services  CM Consult  Post Acute Care Choice:  NA Choice offered to:  NA  DME Arranged:    DME Agency:     HH Arranged:    HH Agency:     Status of Service:  Completed, signed off  Medicare Important Message Given:    Date Medicare IM Given:    Medicare IM give by:    Date Additional Medicare IM Given:    Additional Medicare Important Message give by:     If discussed at Long Length of Stay Meetings, dates discussed:    Additional Comments: Pt admitted from home, lives with mother and ind with ADL's. Pt admitted with heroin OD. CSW has been consulted. No CM needs identified. DC plan  Is inpt tx vs home with self care. Pt has insurance and PCP.  Malcolm Metro, RN 08/03/2015, 4:03 PM

## 2015-08-03 NOTE — Progress Notes (Signed)
[facility] PROGRESS NOTE  Dennis Welch ZOX:[id number] DOB: Jan 26, 1996 DOA: 08/02/2015 PCP: Bennie Pierini, FNP Brief narrative 19 year old male with long-standing heroin abuse brought in by his mother after was found in the bathroom unresponsive. Mother found a needle in the bathroom. EMS was called and received a dose of Narcan with good response. Patient was recently discharged from rehabilitation in [place] for heroin detox where he stayed for almost 6 weeks. As per mother he was also having cough for past several days. Patient possibly aspirated following the overdose. Patient received Narcan drip in the ED. He was very agitated requiring several doses of Ativan and soft restraints. Admitted to stepdown.  Assessment/Plan: Heroin overdose  When necessary Narcan ordered for respiratory distress. Patient is oriented with occasional confusion but very agitated. Continue step down monitoring with when necessary Ativan and Haldol. Besides agitation is not showing any signs of withdrawal. I will add clonidine 0.1 mg 3 times a day. Monitor closely for systemic signs and symptoms of withdrawal. -Discontinue Foley in place a condom catheter. Social work consulted.  Active problem Aspiration pneumonia On empiric IV vancomycin and Zosyn. Follow blood cultures. Elevated lactic acid on presentation which has resolved after IV hydration. procalcitonin normal. Leukocytosis on admission normalized  Hypokalemia Replenished   Attention deficiency disorder  DVT prophylaxis: Subcutaneous Lovenox Diet: Regular  Code Status: Full code Family Communication: Mother at bedside Disposition Plan: Continue inpatient monitoring   Consultants:  None  Procedures:  None  Antibiotics:  IV Vanco and Zosyn since 10/3  HPI/Subjective: Seen and examined. Appears irritable and agitated during the day. Wanting to go home. Denies any chest discomfort, shortness of breath, nausea, vomiting  or abdominal pain.  Objective: Filed Vitals:   08/03/15 1300  BP: 117/72  Pulse:   Temp:   Resp: 16    Intake/Output Summary (Last 24 hours) at 08/03/15 1434 Last data filed at 08/03/15 1253  Gross per 24 hour  Intake 3125.83 ml  Output      0 ml  Net 3125.83 ml   Filed Weights   08/02/15 1955 08/02/15 2345  Weight: 58.514 kg (129 lb) 66.8 kg (147 lb 4.3 oz)    Exam:   General: Young male lying in bed, restless and irritable  HEENT: Dilated pupils, no pallor, moist oral mucosa  Chest: Clear to auscultation bilaterally  CVS: Normal S1 and S2, no murmurs rub or gallop  GI: Soft, nondistended, nontender, bowel sounds present  Musculoskeletal: Warm, no edema  CNS: Alert and oriented, restless    Data Reviewed: Basic Metabolic Panel:  Recent Labs Lab 08/02/15 2018 08/03/15 0427  NA 137 137  K 3.2* 3.9  CL 104 108  CO2 19* 23  GLUCOSE 115* 91  BUN 6 <5*  CREATININE 1.00 0.72  CALCIUM 8.1* 8.0*  MG 1.8  --    Liver Function Tests:  Recent Labs Lab 08/02/15 2018  AST 50*  ALT 19  ALKPHOS 65  BILITOT 0.4  PROT 7.4  ALBUMIN 4.2    Recent Labs Lab 08/02/15 2018  LIPASE 27   No results for input(s): AMMONIA in the last 168 hours. CBC:  Recent Labs Lab 08/02/15 2018 08/03/15 0427  WBC 18.8* 9.8  NEUTROABS 16.1*  --   HGB 15.1 14.1  HCT 42.9 40.3  MCV 89.7 89.4  PLT 246 214   Cardiac Enzymes: No results for input(s): CKTOTAL, CKMB, CKMBINDEX, TROPONINI in the last 168 hours. BNP (last 3 results) No results for input(s): BNP in  the last 8760 hours.  ProBNP (last 3 results) No results for input(s): PROBNP in the last 8760 hours.  CBG: No results for input(s): GLUCAP in the last 168 hours.  Recent Results (from the past 240 hour(s))  Blood Culture (routine x 2)     Status: None (Preliminary result)   Collection Time: 08/02/15  9:30 PM  Result Value Ref Range Status   Specimen Description RIGHT ANTECUBITAL  Final   Special  Requests BOTTLES DRAWN AEROBIC AND ANAEROBIC 6CC  Final   Culture NO GROWTH < 12 HOURS  Final   Report Status PENDING  Incomplete  Blood Culture (routine x 2)     Status: None (Preliminary result)   Collection Time: 08/02/15  9:30 PM  Result Value Ref Range Status   Specimen Description BLOOD LEFT ARM  Final   Special Requests BOTTLES DRAWN AEROBIC AND ANAEROBIC 6CC  Final   Culture NO GROWTH < 12 HOURS  Final   Report Status PENDING  Incomplete  MRSA PCR Screening     Status: None   Collection Time: 08/03/15 12:15 AM  Result Value Ref Range Status   MRSA by PCR NEGATIVE NEGATIVE Final    Comment:        The GeneXpert MRSA Assay (FDA approved for NASAL specimens only), is one component of a comprehensive MRSA colonization surveillance program. It is not intended to diagnose MRSA infection nor to guide or monitor treatment for MRSA infections.      Studies: Dg Chest Port 1 View  08/02/2015   CLINICAL DATA:  Found unconscious with suspected overdose.  EXAM: PORTABLE CHEST 1 VIEW  COMPARISON:  None.  FINDINGS: Cardiac silhouette is normal. There is soft tissue thickening in the right peritracheal location, obscuring the right mediastinal border. Diffuse haziness of the right lung is also seen. The left lung is clear.  There is no evidence of pneumothorax.  Osseous structures are without acute abnormality. Soft tissues are grossly normal.  IMPRESSION: Soft tissue thickening in right peritracheal location, with apparent airspace consolidation of the right lung with upper lobe predominance. Findings may represent aspiration pneumonia, however mediastinal mass cannot be excluded. Follow-up to resolution is recommended.   Electronically Signed   By: Ted Mcalpine M.D.   On: 08/02/2015 20:54    Scheduled Meds: . enoxaparin (LOVENOX) injection  40 mg Subcutaneous Q24H  . haloperidol lactate  2 mg Intravenous Once  . piperacillin-tazobactam (ZOSYN)  IV  3.375 g Intravenous Q8H  .  vancomycin  500 mg Intravenous Q8H   Continuous Infusions: . sodium chloride 125 mL/hr at 08/03/15 1300    Principal Problem:   Heroin overdose Active Problems:   ADD (attention deficit disorder)   Overdose   Aspiration pneumonia (HCC)    Time spent: 25 minutes    Elder Davidian  [facility] Pager [phone]. If 7PM-7AM, please contact night-coverage at [email or web address], password [facility] 08/03/2015, 2:34 PM  LOS: 0 days

## 2015-08-03 NOTE — Clinical Social Work Note (Signed)
Clinical Social Work Assessment  Patient Details  Name: Dennis Welch MRN: [id number] Date of Birth: 12/26/1995  Date of referral:  08/03/15               Reason for consult:  Substance Use/ETOH Abuse                Permission sought to share information with:    Permission granted to share information::     Name::        Agency::     Relationship::     Contact Information:     Housing/Transportation Living arrangements for the past 2 months:  Single Family Home Source of Information:  Parent Patient Interpreter Needed:  None Criminal Activity/Legal Involvement Pertinent to Current Situation/Hospitalization:  No - Comment as needed Significant Relationships:  Siblings, Parents Lives with:  Parents Do you feel safe going back to the place where you live?  Yes Need for family participation in patient care:  Yes (Comment)  Care giving concerns: None identified.    Social Worker assessment / plan:  Patient was restrained and sedated.  CSW met with patient's mother, Dennis Welch, who provided patient's history.  Ms. Gertsch advised that patient has been "on and off" drugs for the past two years.  She stated that he has been in inpatient rehabs in [place], [place] and most recently in [place].  She stated that patient just returned form [facility] in [place] where he was for 30-40 days.  She stated that initially, patient was doing well until this episode of SA use.  She indicated that his previous inpatient provider had recommended SAIOP treatment and that they had spoke with [facility] about patient getting enrolled in their SAIOP. Ms. Wempe advised that patient had been trying to make the decision as to whether he wanted to find employment or go to [facility] (it is three or four days per week for about three hours per mother).  Ms. Ferraris advised that she felt that since this incident happened, that patient would need to go to [facility] instead of finding work.  CSW provided supportive  counseling and encouragement to patient's mother.  CSW will reassess patient when when he is alert.    Employment status:  Unemployed Nurse, adult PT Recommendations:  Not assessed at this time Information / Referral to community resources:     Patient/Family's Response to care:  Patient's mother agrees that patient needs to go to [employer].   Patient/Family's Understanding of and Emotional Response to Diagnosis, Current Treatment, and Prognosis:  Patient's mother understands patient's diagnosis, treatment and prognosis.    Emotional Assessment Appearance:  Appears stated age Attitude/Demeanor/Rapport:  Unable to Assess Affect (typically observed):  Unable to Assess Orientation:    Alcohol / Substance use:  Illicit Drugs Psych involvement (Current and /or in the community):  No (Comment)  Discharge Needs  Concerns to be addressed:  Discharge Planning Concerns Readmission within the last 30 days:  No Current discharge risk:  Substance Abuse Barriers to Discharge:  No Barriers Identified   Ihor Gully, LCSW 08/03/2015, 11:59 AM [phone]

## 2015-08-04 DIAGNOSIS — F909 Attention-deficit hyperactivity disorder, unspecified type: Secondary | ICD-10-CM

## 2015-08-04 DIAGNOSIS — T401X1D Poisoning by heroin, accidental (unintentional), subsequent encounter: Secondary | ICD-10-CM

## 2015-08-04 DIAGNOSIS — J690 Pneumonitis due to inhalation of food and vomit: Secondary | ICD-10-CM

## 2015-08-04 LAB — BASIC METABOLIC PANEL
Anion gap: 7 (ref 5–15)
CHLORIDE: 109 mmol/L (ref 101–111)
CO2: 24 mmol/L (ref 22–32)
CREATININE: 0.72 mg/dL (ref 0.61–1.24)
Calcium: 8.3 mg/dL — ABNORMAL LOW (ref 8.9–10.3)
GFR calc Af Amer: >60 mL/min (ref >60)
GFR calc non Af Amer: >60 mL/min (ref >60)
Glucose, Bld: 90 mg/dL (ref 65–99)
POTASSIUM: 3.7 mmol/L (ref 3.5–5.1)
Sodium: 140 mmol/L (ref 135–145)

## 2015-08-04 MED ORDER — CLONIDINE HCL 0.1 MG PO TABS: 0.1000 mg | ORAL_TABLET | Freq: Three times a day (TID) | ORAL | Status: DC

## 2015-08-04 MED ORDER — CLINDAMYCIN HCL 300 MG PO CAPS
300.0000 mg | ORAL_CAPSULE | Freq: Three times a day (TID) | ORAL | Status: DC
Start: 2015-08-04 — End: 2016-02-22

## 2015-08-04 NOTE — Discharge Summary (Signed)
Physician Discharge Summary  Dennis Welch ZOX:[id number] DOB: Jun 20, 1996 DOA: 08/02/2015  PCP: Bennie Pierini, FNP  Admit date: 08/02/2015 Discharge date: 08/04/2015  Time spent: 45 minutes  Recommendations for Outpatient Follow-up:  -Will be discharged home today. -Advised to follow up with PCP in 2 weeks. -Has refused SW resources for drug rehab.   Discharge Diagnoses:  Principal Problem:   Heroin overdose Active Problems:   ADD (attention deficit disorder)   Overdose   Aspiration pneumonia (HCC)   Aspiration pneumonia of right upper lobe (HCC)   Agitation   Discharge Condition: Stable and improved  Filed Weights   08/02/15 1955 08/02/15 2345 08/04/15 0400  Weight: 58.514 kg (129 lb) 66.8 kg (147 lb 4.3 oz) 66.8 kg (147 lb 4.3 oz)    History of present illness:  As per Dr. Onalee Hua 10/3: 19 yo male with long standing h/o heroin abuse comes in after mom heard him fall in the bathroom she found him on the floor unresponsive with a needle next to him in the bathroom. EMS was called ,given a dose of narcan and had good response. Mother reported to ED earlier that he has been coughing for several days.  Hospital Course:   Heroin Overdose -Did require narcan. -Currently oriented and apologetic. -States he just "got out of rehab for 45 days" and is not interested in returning. "It was just a wicked party", "I was around the wrong people again". -Not interested in rehab options and is adamant about leaving the hospital today.  Aspiration Pneumonia -Clindamycin for 8 days. -Afebrile/no leukocytosis.  Hypokalemia -Repleted.  Procedures:  None   Consultations:  None  Discharge Instructions  Discharge Instructions    Increase activity slowly    Complete by:  As directed             Medication List    STOP taking these medications        lisdexamfetamine 50 MG capsule  Commonly known as:  VYVANSE      TAKE these medications        clindamycin 300 MG capsule  Commonly known as:  CLEOCIN  Take 1 capsule (300 mg total) by mouth 3 (three) times daily. For 8 days     cloNIDine 0.1 MG tablet  Commonly known as:  CATAPRES  Take 1 tablet (0.1 mg total) by mouth 3 (three) times daily.       Allergies  Allergen Reactions  . Sulfa Antibiotics Other (See Comments)    Child hood allergy       Follow-up Information    Follow up with Bennie Pierini, FNP. Schedule an appointment as soon as possible for a visit in 2 weeks.   Specialty:  Nurse Practitioner   Contact information:   [address] [place] [address] [phone]        The results of significant diagnostics from this hospitalization (including imaging, microbiology, ancillary and laboratory) are listed below for reference.    Significant Diagnostic Studies: Dg Chest Port 1 View  08/02/2015   CLINICAL DATA:  Found unconscious with suspected overdose.  EXAM: PORTABLE CHEST 1 VIEW  COMPARISON:  None.  FINDINGS: Cardiac silhouette is normal. There is soft tissue thickening in the right peritracheal location, obscuring the right mediastinal border. Diffuse haziness of the right lung is also seen. The left lung is clear.  There is no evidence of pneumothorax.  Osseous structures are without acute abnormality. Soft tissues are grossly normal.  IMPRESSION: Soft tissue thickening  in right peritracheal location, with apparent airspace consolidation of the right lung with upper lobe predominance. Findings may represent aspiration pneumonia, however mediastinal mass cannot be excluded. Follow-up to resolution is recommended.   Electronically Signed   By: Ted Mcalpine M.D.   On: 08/02/2015 20:54    Microbiology: Recent Results (from the past 240 hour(s))  Blood Culture (routine x 2)     Status: None (Preliminary result)   Collection Time: 08/02/15  9:30 PM  Result Value Ref Range Status   Specimen Description RIGHT ANTECUBITAL  Final   Special  Requests BOTTLES DRAWN AEROBIC AND ANAEROBIC 6CC  Final   Culture NO GROWTH < 12 HOURS  Final   Report Status PENDING  Incomplete  Blood Culture (routine x 2)     Status: None (Preliminary result)   Collection Time: 08/02/15  9:30 PM  Result Value Ref Range Status   Specimen Description BLOOD LEFT ARM  Final   Special Requests BOTTLES DRAWN AEROBIC AND ANAEROBIC 6CC  Final   Culture NO GROWTH < 12 HOURS  Final   Report Status PENDING  Incomplete  MRSA PCR Screening     Status: None   Collection Time: 08/03/15 12:15 AM  Result Value Ref Range Status   MRSA by PCR NEGATIVE NEGATIVE Final    Comment:        The GeneXpert MRSA Assay (FDA approved for NASAL specimens only), is one component of a comprehensive MRSA colonization surveillance program. It is not intended to diagnose MRSA infection nor to guide or monitor treatment for MRSA infections.      Labs: Basic Metabolic Panel:  Recent Labs Lab 08/02/15 2018 08/03/15 0427 08/04/15 0448  NA 137 137 140  K 3.2* 3.9 3.7  CL 104 108 109  CO2 19* 23 24  GLUCOSE 115* 91 90  BUN 6 <5* <5*  CREATININE 1.00 0.72 0.72  CALCIUM 8.1* 8.0* 8.3*  MG 1.8  --   --    Liver Function Tests:  Recent Labs Lab 08/02/15 2018  AST 50*  ALT 19  ALKPHOS 65  BILITOT 0.4  PROT 7.4  ALBUMIN 4.2    Recent Labs Lab 08/02/15 2018  LIPASE 27   No results for input(s): AMMONIA in the last 168 hours. CBC:  Recent Labs Lab 08/02/15 2018 08/03/15 0427  WBC 18.8* 9.8  NEUTROABS 16.1*  --   HGB 15.1 14.1  HCT 42.9 40.3  MCV 89.7 89.4  PLT 246 214   Cardiac Enzymes: No results for input(s): CKTOTAL, CKMB, CKMBINDEX, TROPONINI in the last 168 hours. BNP: BNP (last 3 results) No results for input(s): BNP in the last 8760 hours.  ProBNP (last 3 results) No results for input(s): PROBNP in the last 8760 hours.  CBG: No results for input(s): GLUCAP in the last 168 hours.     SignedChaya Jan  [facility] Pager: [phone] 08/04/2015, 9:07 AM

## 2015-08-04 NOTE — Clinical Social Work Note (Signed)
CSW met with patient, his mother was at bedside. CSW discussed with patient the purpose of the referral was issues concerning his substance abuse. He gave CSW permission to discuss the issues in his mother's presence.  Patient advised that he really does not have any memory since Friday.  He stated the last thing he remembers is being at the fair with his family then waking up here at [facility].  Patient admitted that he took multiple Xanax ("four bars") and that he does not remember getting the heroin nor using the heroin.  Patient stated that he just got out of rehab and that he does not understand where why some of his friends can remain sober after going to rehab yet he struggles with living a sober lifestyle. CSW discussed the nature of addiction and remaining substance free is a difficult and individual. Patient advised that he wants to remain sober but he "likes" drugs and the "feel so good." CSW discussed with patient that this current hospitalization was due to an overdose that nearly took his life and that another episode could potentially end his life. Patient stated that he understood that his substance abuse was dangerous and could kill him.  Patient's mother and CSW discussed with patient the recommendation from his inpatient provider for him to attend SAIOP.  Patient was agreeable to attend SAIOP at [facility].  Patient's mother advised that she would schedule this treatment for patient.  CSW provided patient with information on local SA providers and a fact sheet on heroin.  Patient stated that he has a NA app on his phone, however he would attend SAIOP at this time to address his significant SA issues.   CSW signing off.   Ihor Gully, [place] [phone]

## 2015-08-04 NOTE — Progress Notes (Signed)
IVs were removed. Discharge instructions provided to pt and his mother.  Discussed naloxone availability and gave contact information for [facility] for overdose kits. Pt left the floor with all belongings accompanied by his mother.

## 2015-08-04 NOTE — Care Management Note (Signed)
Case Management Note  Patient Details  Name: RUTH TULLY MRN: [id number] Date of Birth: 03-Jan-1996  Expected Discharge Date:                  Expected Discharge Plan:  Home/Self Care  In-House Referral:  Clinical Social Work  Discharge planning Services  CM Consult  Post Acute Care Choice:  NA Choice offered to:  NA  DME Arranged:    DME Agency:     HH Arranged:    HH Agency:     Status of Service:  Completed, signed off  Medicare Important Message Given:    Date Medicare IM Given:    Medicare IM give by:    Date Additional Medicare IM Given:    Additional Medicare Important Message give by:     If discussed at Long Length of Stay Meetings, dates discussed:    Additional Comments: Pt discharging home today with self care. No CM needs noted.  Malcolm Metro, RN 08/04/2015, 9:53 AM

## 2015-08-09 LAB — CULTURE, BLOOD (ROUTINE X 2)
CULTURE: NO GROWTH
Culture: NO GROWTH

## 2016-02-22 ENCOUNTER — Encounter: Payer: Self-pay | Admitting: Family

## 2016-02-22 ENCOUNTER — Ambulatory Visit (INDEPENDENT_AMBULATORY_CARE_PROVIDER_SITE_OTHER): Payer: BLUE CROSS/BLUE SHIELD | Admitting: Family

## 2016-02-22 VITALS — BP 133/80 | HR 123 | Temp 98.5°F | Ht 68.0 in | Wt 134.4 lb

## 2016-02-22 DIAGNOSIS — J019 Acute sinusitis, unspecified: Secondary | ICD-10-CM | POA: Diagnosis not present

## 2016-02-22 MED ORDER — AMOXICILLIN-POT CLAVULANATE 875-125 MG PO TABS: 1.0000 | ORAL_TABLET | Freq: Two times a day (BID) | ORAL | Status: DC

## 2016-02-22 NOTE — Progress Notes (Signed)
   Subjective:    Patient ID: Dennis Welch, male    DOB: 03/28/1996, 20 y.o.   MRN: [id number]  Sinus Problem This is a new problem. The current episode started 1 to 4 weeks ago. The problem has been rapidly worsening since onset. There has been no fever. His pain is at a severity of 7/10. The pain is mild. Associated symptoms include congestion, coughing, headaches, sinus pressure and sneezing. Pertinent negatives include no ear pain, hoarse voice, sore throat or swollen glands. Past treatments include acetaminophen. The treatment provided no relief.      Review of Systems  HENT: Positive for congestion, sinus pressure and sneezing. Negative for ear pain, hoarse voice and sore throat.   Respiratory: Positive for cough.   Neurological: Positive for headaches.  All other systems reviewed and are negative.      Objective:   Physical Exam  Constitutional: He is oriented to person, place, and time. He appears well-developed and well-nourished. No distress.  HENT:  Head: Normocephalic.  Nose: Right sinus exhibits maxillary sinus tenderness. Left sinus exhibits maxillary sinus tenderness.  Nasal passage erythemas with mild swelling Oropharynx erythemas, bilateral cerumen impaction      Eyes: Pupils are equal, round, and reactive to light. Right eye exhibits no discharge. Left eye exhibits no discharge.  Neck: Normal range of motion. Neck supple. No thyromegaly present.  Cardiovascular: Normal rate, regular rhythm, normal heart sounds and intact distal pulses.   No murmur heard. Pulmonary/Chest: Effort normal and breath sounds normal. No respiratory distress. He has no wheezes.  Abdominal: Soft. Bowel sounds are normal. He exhibits no distension. There is no tenderness.  Musculoskeletal: Normal range of motion. He exhibits no edema or tenderness.  Neurological: He is alert and oriented to person, place, and time. He has normal reflexes. No cranial nerve deficit.  Skin: Skin is warm and  dry. No rash noted. No erythema.  Psychiatric: He has a normal mood and affect. His behavior is normal. Judgment and thought content normal.  Vitals reviewed.     BP 133/80 mmHg  Pulse 123  Temp(Src) 98.5 F (36.9 C) (Oral)  Ht 5\' 8"  (1.727 m)  Wt 134 lb 6.4 oz (60.963 kg)  BMI 20.44 kg/m2     Assessment & Plan:  1. Acute sinusitis, recurrence not specified, unspecified location -- Take meds as prescribed - Use a cool mist humidifier  -Use saline nose sprays frequently -Saline irrigations of the nose can be very helpful if done frequently.  * 4X daily for 1 week*  * Use of a nettie pot can be helpful with this. Follow directions with this* -Force fluids -For any cough or congestion  Use plain Mucinex- regular strength or max strength is fine   * Children- consult with Pharmacist for dosing -For fever or aces or pains- take tylenol or ibuprofen appropriate for age and weight.  * for fevers greater than 101 orally you may alternate ibuprofen and tylenol every  3 hours. -Throat lozenges if help - amoxicillin-clavulanate (AUGMENTIN) 875-125 MG tablet; Take 1 tablet by mouth 2 (two) times daily.  Dispense: 14 tablet; Refill: 0  Jannifer Rodneyhristy Hawks, FNP

## 2016-02-22 NOTE — Patient Instructions (Signed)
Sinusitis, Adult Sinusitis is redness, soreness, and inflammation of the paranasal sinuses. Paranasal sinuses are air pockets within the bones of your face. They are located beneath your eyes, in the middle of your forehead, and above your eyes. In healthy paranasal sinuses, mucus is able to drain out, and air is able to circulate through them by way of your nose. However, when your paranasal sinuses are inflamed, mucus and air can become trapped. This can allow bacteria and other germs to grow and cause infection. Sinusitis can develop quickly and last only a short time (acute) or continue over a long period (chronic). Sinusitis that lasts for more than 12 weeks is considered chronic. CAUSES Causes of sinusitis include:  Allergies.  Structural abnormalities, such as displacement of the cartilage that separates your nostrils (deviated septum), which can decrease the air flow through your nose and sinuses and affect sinus drainage.  Functional abnormalities, such as when the small hairs (cilia) that line your sinuses and help remove mucus do not work properly or are not present. SIGNS AND SYMPTOMS Symptoms of acute and chronic sinusitis are the same. The primary symptoms are pain and pressure around the affected sinuses. Other symptoms include:  Upper toothache.  Earache.  Headache.  Bad breath.  Decreased sense of smell and taste.  A cough, which worsens when you are lying flat.  Fatigue.  Fever.  Thick drainage from your nose, which often is green and may contain pus (purulent).  Swelling and warmth over the affected sinuses. DIAGNOSIS Your health care provider will perform a physical exam. During your exam, your health care provider may perform any of the following to help determine if you have acute sinusitis or chronic sinusitis:  Look in your nose for signs of abnormal growths in your nostrils (nasal polyps).  Tap over the affected sinus to check for signs of  infection.  View the inside of your sinuses using an imaging device that has a light attached (endoscope). If your health care provider suspects that you have chronic sinusitis, one or more of the following tests may be recommended:  Allergy tests.  Nasal culture. A sample of mucus is taken from your nose, sent to a lab, and screened for bacteria.  Nasal cytology. A sample of mucus is taken from your nose and examined by your health care provider to determine if your sinusitis is related to an allergy. TREATMENT Most cases of acute sinusitis are related to a viral infection and will resolve on their own within 10 days. Sometimes, medicines are prescribed to help relieve symptoms of both acute and chronic sinusitis. These may include pain medicines, decongestants, nasal steroid sprays, or saline sprays. However, for sinusitis related to a bacterial infection, your health care provider will prescribe antibiotic medicines. These are medicines that will help kill the bacteria causing the infection. Rarely, sinusitis is caused by a fungal infection. In these cases, your health care provider will prescribe antifungal medicine. For some cases of chronic sinusitis, surgery is needed. Generally, these are cases in which sinusitis recurs more than 3 times per year, despite other treatments. HOME CARE INSTRUCTIONS  Drink plenty of water. Water helps thin the mucus so your sinuses can drain more easily.  Use a humidifier.  Inhale steam 3-4 times a day (for example, sit in the bathroom with the shower running).  Apply a warm, moist washcloth to your face 3-4 times a day, or as directed by your health care provider.  Use saline nasal sprays to help   moisten and clean your sinuses.  Take medicines only as directed by your health care provider.  If you were prescribed either an antibiotic or antifungal medicine, finish it all even if you start to feel better. SEEK IMMEDIATE MEDICAL CARE IF:  You have  increasing pain or severe headaches.  You have nausea, vomiting, or drowsiness.  You have swelling around your face.  You have vision problems.  You have a stiff neck.  You have difficulty breathing.   This information is not intended to replace advice given to you by your health care provider. Make sure you discuss any questions you have with your health care provider.   Document Released: 10/16/2005 Document Revised: 11/06/2014 Document Reviewed: 10/31/2011 Elsevier Interactive Patient Education 2016 Elsevier Inc.  - Take meds as prescribed - Use a cool mist humidifier  -Use saline nose sprays frequently -Saline irrigations of the nose can be very helpful if done frequently.  * 4X daily for 1 week*  * Use of a nettie pot can be helpful with this. Follow directions with this* -Force fluids -For any cough or congestion  Use plain Mucinex- regular strength or max strength is fine   * Children- consult with Pharmacist for dosing -For fever or aces or pains- take tylenol or ibuprofen appropriate for age and weight.  * for fevers greater than 101 orally you may alternate ibuprofen and tylenol every  3 hours. -Throat lozenges if help   Christy Hawks, FNP   

## 2016-03-16 ENCOUNTER — Emergency Department (HOSPITAL_COMMUNITY)
Admission: EM | Admit: 2016-03-16 | Discharge: 2016-03-16 | Disposition: A | Discharge status: Home / Self Care | Payer: Self-pay | Attending: Emergency Medicine | Admitting: Emergency Medicine

## 2016-03-16 ENCOUNTER — Emergency Department (HOSPITAL_COMMUNITY): Payer: Self-pay

## 2016-03-16 ENCOUNTER — Encounter (HOSPITAL_COMMUNITY): Payer: Self-pay | Admitting: Emergency Medicine

## 2016-03-16 DIAGNOSIS — F17210 Nicotine dependence, cigarettes, uncomplicated: Secondary | ICD-10-CM | POA: Insufficient documentation

## 2016-03-16 DIAGNOSIS — F1910 Other psychoactive substance abuse, uncomplicated: Secondary | ICD-10-CM | POA: Insufficient documentation

## 2016-03-16 DIAGNOSIS — L03113 Cellulitis of right upper limb: Secondary | ICD-10-CM | POA: Insufficient documentation

## 2016-03-16 DIAGNOSIS — Z791 Long term (current) use of non-steroidal anti-inflammatories (NSAID): Secondary | ICD-10-CM | POA: Insufficient documentation

## 2016-03-16 DIAGNOSIS — L03114 Cellulitis of left upper limb: Secondary | ICD-10-CM | POA: Insufficient documentation

## 2016-03-16 DIAGNOSIS — Z79899 Other long term (current) drug therapy: Secondary | ICD-10-CM | POA: Insufficient documentation

## 2016-03-16 HISTORY — DX: Other psychoactive substance abuse, uncomplicated: F19.10

## 2016-03-16 HISTORY — DX: Opioid abuse, uncomplicated: F11.10

## 2016-03-16 LAB — COMPREHENSIVE METABOLIC PANEL
ALBUMIN: 4.4 g/dL (ref 3.5–5.0)
ALK PHOS: 53 U/L (ref 38–126)
ALT: 15 U/L — ABNORMAL LOW (ref 17–63)
ANION GAP: 8 (ref 5–15)
AST: 20 U/L (ref 15–41)
BUN: 10 mg/dL (ref 6–20)
CALCIUM: 9.1 mg/dL (ref 8.9–10.3)
CHLORIDE: 103 mmol/L (ref 101–111)
CO2: 27 mmol/L (ref 22–32)
Creatinine, Ser: 0.81 mg/dL (ref 0.61–1.24)
GFR calc non Af Amer: >60 mL/min (ref >60)
GLUCOSE: 105 mg/dL — AB (ref 65–99)
Potassium: 3.7 mmol/L (ref 3.5–5.1)
SODIUM: 138 mmol/L (ref 135–145)
Total Bilirubin: 0.6 mg/dL (ref 0.3–1.2)
Total Protein: 8 g/dL (ref 6.5–8.1)

## 2016-03-16 LAB — URINE MICROSCOPIC-ADD ON: RBC / HPF: NONE SEEN RBC/hpf (ref 0–5)

## 2016-03-16 LAB — CBC
HCT: 38.2 % — ABNORMAL LOW (ref 39.0–52.0)
HEMOGLOBIN: 13.2 g/dL (ref 13.0–17.0)
MCH: 29 pg (ref 26.0–34.0)
MCHC: 34.6 g/dL (ref 30.0–36.0)
MCV: 84 fL (ref 78.0–100.0)
Platelets: 249 10*3/uL (ref 150–400)
RBC: 4.55 MIL/uL (ref 4.22–5.81)
RDW: 12.9 % (ref 11.5–15.5)
WBC: 7.3 10*3/uL (ref 4.0–10.5)

## 2016-03-16 LAB — URINALYSIS, ROUTINE W REFLEX MICROSCOPIC
Glucose, UA: NEGATIVE mg/dL
Hgb urine dipstick: NEGATIVE
LEUKOCYTES UA: NEGATIVE
NITRITE: POSITIVE — AB
PH: 6 (ref 5.0–8.0)
Protein, ur: 100 mg/dL — AB
SPECIFIC GRAVITY, URINE: 1.025 (ref 1.005–1.030)

## 2016-03-16 LAB — ETHANOL

## 2016-03-16 LAB — RAPID URINE DRUG SCREEN, HOSP PERFORMED
AMPHETAMINES: NOT DETECTED
Barbiturates: NOT DETECTED
Benzodiazepines: POSITIVE — AB
Cocaine: NOT DETECTED
OPIATES: POSITIVE — AB
Tetrahydrocannabinol: POSITIVE — AB

## 2016-03-16 LAB — SALICYLATE LEVEL

## 2016-03-16 LAB — ACETAMINOPHEN LEVEL: Acetaminophen (Tylenol), Serum: <10 ug/mL — ABNORMAL LOW (ref 10–30)

## 2016-03-16 LAB — CBG MONITORING, ED: GLUCOSE-CAPILLARY: 106 mg/dL — AB (ref 65–99)

## 2016-03-16 MED ORDER — CLINDAMYCIN HCL 150 MG PO CAPS: ORAL_CAPSULE | ORAL | Status: DC

## 2016-03-16 MED ORDER — NALOXONE HCL 0.4 MG/ML IJ SOLN: 0.4000 mg | Freq: Once | INTRAMUSCULAR | Status: DC

## 2016-03-16 MED ORDER — CLINDAMYCIN HCL 150 MG PO CAPS
450.0000 mg | ORAL_CAPSULE | Freq: Once | ORAL | Status: AC
  Administered 2016-03-16: 450 mg via ORAL
  Filled 2016-03-16: qty 3

## 2016-03-16 NOTE — Discharge Instructions (Signed)
Community Resource Guide Outpatient Counseling/Substance Abuse Adult °The [employer]’s “211” is a great source of information about community services available.  Access by dialing 2-1-1 from anywhere in [place], or by website -  www.nc211.org.  ° °Other Local Resources (Updated 10/2015) ° °Crisis Hotlines °  °Services  ° °  °Area Served  °[facility] • Crisis Hotline, available 24 hours a day, 7 days a week: 800-939-5911 [place], [place]  ° [facility] • Crisis Hotline, available 24 hours a day, 7 days a week: 866-275-9552 [place], [place]  °[facility] • Suicide Prevention Hotline, available 24 hours a day, 7 days a week: 800-273-8255 [place], [place]  °[facility] ° • Crisis Hotline, available 24 hours a day, 7 days a week: 336-676-6840 [place], [place] °  °[facility] • Crisis Hotline, available 24 hours a day, 7 days a week: 800-256-2452 All °  °[facility] • Crisis Hotline, available 24 hours a day, 7 days a week: 877-626-1772 All  ° °Other Local Resources (Updated 10/2015) ° °Outpatient Counseling/ Substance Abuse Programs  °Services  ° °  °Address and Phone Number  °[facility] ° • Options include Individual counseling, group counseling, intensive outpatient program (several hours a day, several days a week) °• Offers depression assessments °• Provides methadone maintenance program 336-333-6860 °[address] °[place], [place] [address] °  °[facility] ° • Offers partial hospitalization/day treatment and DUI/DWI programs °• Accepts Medicare, private insurance 336-299-4655 °[address], [address] °[place], [place] [address]  °[facility] ° ° • Services include intensive outpatient program (several hours a day, several days a week), outpatient treatment, DUI/DWI services, family education °• Also has some services specifically for Veterans °• Offers transitional housing   [phone] °[address] °[place], [place] [address] °  °  °[facility] • Accepts Medicare, private pay, and private insurance [phone] °[address] °[place], [place] [address]  °[facility] • Services include individual counseling, substance abuse intensive outpatient program (several hours a day, several days a week), day treatment °• Accepts Medicare, Medicaid, private insurance [phone] °[address] °[place], [place] [address]  °[facility] ° • Offers substance abuse intensive outpatient program (several hours a day, several days a week), partial hospitalization program [phone] °[address] °[place], [place] [address] ° °[phone] °[address] °[place], [place] [address] ° °336-386-3795 °[address] °[place], [place] [address] ° °[phone] °[address] °[place], [place] [address]  °[facility] • Individual counseling only °• Accepts private insurance only [phone] °[address] °[place], [place] [address]  °[facility] • Methadone maintenance program [phone] °[address] °[place], [place] [address]  °[facility] • [facility] Clinic providing substance abuse and mental health counseling °• Accepts Medicaid, Medicare, private insurance °• Offers sliding scale for uninsured 336-342-8316 °[address] °[place], [place]   °[facility] • Offers individual counseling, and intensive in-home services [phone] °[address] °[place], [place] [address]  °[facility] • Offers individual counseling, family counseling, group therapy, domestic violence counseling, consumer credit counseling °• Accepts Medicare, Medicaid, private insurance °• Offers sliding scale for uninsured 336-387-6161 °[address] °[place], [place] [address] ° °336-889-6161 °[place], [address] °[place],  [address]  °[facility] • Offers individual, family   and group counseling  3 locations - [place], [place], and [employer]  [phone]  [address] [place], [place] [address]  [address] [place], [place] [address]  [address] [place], [place] [address]  [facility]    Offers psychiatric assessment, 8-week Intensive Outpatient Program (several hours a day, several times a week, daytime or evenings), early recovery group, family Program, medication management  Private pay or private insurance only [phone], or  [phone] [address] [place], [place] [address]  [facility] individual, couples and family counseling  Accepts Medicaid, private insurance, and sliding scale for uninsured [phone] [address] [place], [place] [address]  Len Blalock, MD  Individual counseling  Private insurance [phone] [address] [place], [place] [address]  [facility]   Offers assessment, substance abuse treatment, and behavioral health treatment [phone] [address] [place], [place] [address]  [facility]  Individual counseling  Accepts private insurance [phone] [address] [place], [place] [address]  [facility]  Individual counseling  Delene Loll, private insurance [phone] [address] [place], [place] [address]  [facility]    Offers intensive outpatient program (several hours a day, several times a week)  Private pay, private insurance [phone] [facility] [place], [place]  [facility]  Individual counseling  Medicare, private insurance [phone] [address] [place], [place] [address]  [facility]    Offers intensive outpatient program (several hours a day, several times a week) and partial hospitalization  program [phone] [address] [place], [place] [address]  Emerson Monte, MD  Individual counseling [phone] [address] [place], [place] [address]  [facility]  Offers Christian counseling to individuals, couples, and families  Accepts Medicare and private insurance; offers sliding scale for uninsured [phone] [address] [place], [place] [address]  [facility]  [place] [facility] [phone] [address] [place], [place] [address]  [facility] crisis counseling, individual counseling, group therapy, in-home therapy, domestic violence services, day treatment, DWI services, Administrator, arts (CST), Assertive Community Treatment Team (ACTT), substance abuse Intensive Outpatient Program (several hours a day, several times a week)  2 locations - [place] and [facility] [phone] [address] [place], [place] [address]  [phone] [address] [place], [place] [address]  [facility]     Individual counseling and group therapy  Accepts private insurance, [place], [place] [phone] [address] [place], [place]  [facility]  Offers individual and family counseling  Offers LGBTQ services  Accepts private insurance and private pay [phone] [address] [place], [place] [address]  [facility]    Offers individual counseling, group therapy, and outpatient detox  Accepts private insurance [phone] [address] [place], [place]  [facility]  Individual counseling  Accepts Medicare, private insurance [phone] [address] [place], [place] [address]  [facility]  Individual counseling  Accepts Medicare, private insurance [phone] [address] [place], [place] [address]  [facility]   Offers substance abuse  Intensive Outpatient Program (several hours a day, several times a week) [phone], or [phone] [place], [place]    Take your usual prescriptions as previously directed. Take the new prescription as directed. Do not inject yourself in the reddened areas. Call your regular medical doctor tomorrow to schedule a follow up appointment within  the next 2 days.  Return to the Emergency Department immediately sooner if worsening.

## 2016-03-16 NOTE — ED Notes (Signed)
PT mother reports pt has had decreased energy and generalized weakness x2 days. PT and mother reports heroin use and last use this am. Pupils noted to be dilated and pt slow to answer questions. PT also reports redness and swelling to right hand x2 days.

## 2016-03-16 NOTE — ED Provider Notes (Signed)
CSN: [id number]     Arrival date & time 03/16/16  1441 History   First MD Initiated Contact with Patient 03/16/16 1551     Chief Complaint  Patient presents with  . Altered Mental Status      HPI Pt was seen at 1550. Per pt and his mother, c/o gradual onset and persistence of constant generalized weakness/fatigue for the past 2 days. Pt's mother states pt has had "slow speech" and "no energy." Pt has hx of heroin use but mother states "he said he was going to use less because he just got a new job." Pt also c/o erythema to dorsal right hand and left volar forearm for the past 2 days. Endorses these were recent injection sites. LD heroin this morning. Denies CP/palpitations, no SOB/cough, no abd pain, no N/V/D, no focal motor weakness, no tingling/numbness in extremities, no fever.     Past Medical History  Diagnosis Date  . ADD (attention deficit disorder with hyperactivity)   . Vomiting   . Heroin abuse    Past Surgical History  Procedure Laterality Date  . Wisdom tooth extraction     Family History  Problem Relation Age of Onset  . Healthy Mother   . Healthy Father   . Healthy Brother    Social History  Substance Use Topics  . Smoking status: Current Every Day Smoker -- 1.00 packs/day for 4 years    Types: Cigarettes  . Smokeless tobacco: None  . Alcohol Use: No    Review of Systems ROS: Statement: All systems negative except as marked or noted in the HPI; Constitutional: Negative for fever and chills. +generalized weakness/fatigue.; ; Eyes: Negative for eye pain, redness and discharge. ; ; ENMT: Negative for ear pain, hoarseness, nasal congestion, sinus pressure and sore throat. ; ; Cardiovascular: Negative for chest pain, palpitations, diaphoresis, dyspnea and peripheral edema. ; ; Respiratory: Negative for cough, wheezing and stridor. ; ; Gastrointestinal: Negative for nausea, vomiting, diarrhea, abdominal pain, blood in stool, hematemesis, jaundice and rectal bleeding. .  ; ; Genitourinary: Negative for dysuria, flank pain and hematuria. ; ; Musculoskeletal: Negative for back pain and neck pain. Negative for swelling and trauma.; ; Skin: +rash. Negative for pruritus, abrasions, blisters, bruising and skin lesion.; ; Neuro: +AMS. Negative for headache, lightheadedness and neck stiffness. Negative for altered level of consciousness, extremity weakness, paresthesias, involuntary movement, seizure and syncope.     Allergies  Sulfa antibiotics  Home Medications   Prior to Admission medications   Medication Sig Start Date End Date Taking? Authorizing Provider  cloNIDine (CATAPRES) 0.1 MG tablet Take 1 tablet (0.1 mg total) by mouth 3 (three) times daily. 08/04/15  Yes Henderson Cloud, MD  ibuprofen (ADVIL,MOTRIN) 200 MG tablet Take 400 mg by mouth daily as needed for mild pain.   Yes Historical Provider, MD  amoxicillin-clavulanate (AUGMENTIN) 875-125 MG tablet Take 1 tablet by mouth 2 (two) times daily. Patient not taking: Reported on 03/16/2016 02/22/16   Junie Spencer, FNP   BP 112/69 mmHg  Pulse 50  Temp(Src) 99 F (37.2 C) (Oral)  Resp 14  Wt 134 lb (60.782 kg)  SpO2 98% Physical Exam  1555: Physical examination:  Nursing notes reviewed; Vital signs and O2 SAT reviewed;  Constitutional: Well developed, Well nourished, Well hydrated, In no acute distress; Head:  Normocephalic, atraumatic; Eyes: EOMI, PERRL, dilated. No scleral icterus; ENMT: Mouth and pharynx normal, Mucous membranes moist; Neck: Supple, Full range of motion, No lymphadenopathy; Cardiovascular: Regular rate  and rhythm, No murmur, rub, or gallop; Respiratory: Breath sounds clear & equal bilaterally, No rales, rhonchi, wheezes.  Speaking full sentences with ease, Normal respiratory effort/excursion; Chest: Nontender, Movement normal; Abdomen: Soft, Nontender, Nondistended, Normal bowel sounds; Genitourinary: No CVA tenderness; Extremities: Pulses normal, +erythema to right dorsal hand and  left medial volar proximal forearm. No palp abscesses, no fluctuance, no ecchymosis, no soft tissue crepitus, no drainage, no streaking. No calf edema or asymmetry.; Neuro: AA&Ox3, Major CN grossly intact. No facial droop. Slow to answer questions, speech clear. No gross focal motor or sensory deficits in extremities.; Skin: Color normal, Warm, Dry.   ED Course  Procedures (including critical care time) Labs Review   Imaging Review  I have personally reviewed and evaluated these images and lab results as part of my medical decision-making.   EKG Interpretation None      MDM  MDM Reviewed: previous chart, nursing note and vitals Reviewed previous: labs Interpretation: labs, x-ray and CT scan     Results for orders placed or performed during the hospital encounter of 03/16/16  Comprehensive metabolic panel  Result Value Ref Range   Sodium 138 135 - 145 mmol/L   Potassium 3.7 3.5 - 5.1 mmol/L   Chloride 103 101 - 111 mmol/L   CO2 27 22 - 32 mmol/L   Glucose, Bld 105 (H) 65 - 99 mg/dL   BUN 10 6 - 20 mg/dL   Creatinine, Ser 1.610.81 0.61 - 1.24 mg/dL   Calcium 9.1 8.9 - 09.610.3 mg/dL   Total Protein 8.0 6.5 - 8.1 g/dL   Albumin 4.4 3.5 - 5.0 g/dL   AST 20 15 - 41 U/L   ALT 15 (L) 17 - 63 U/L   Alkaline Phosphatase 53 38 - 126 U/L   Total Bilirubin 0.6 0.3 - 1.2 mg/dL   GFR calc non Af Amer >60 >60 mL/min   GFR calc Af Amer >60 >60 mL/min   Anion gap 8 5 - 15  CBC  Result Value Ref Range   WBC 7.3 4.0 - 10.5 K/uL   RBC 4.55 4.22 - 5.81 MIL/uL   Hemoglobin 13.2 13.0 - 17.0 g/dL   HCT 04.538.2 (L) 40.939.0 - 81.152.0 %   MCV 84.0 78.0 - 100.0 fL   MCH 29.0 26.0 - 34.0 pg   MCHC 34.6 30.0 - 36.0 g/dL   RDW 91.412.9 78.211.5 - 95.615.5 %   Platelets 249 150 - 400 K/uL  Acetaminophen level  Result Value Ref Range   Acetaminophen (Tylenol), Serum <10 (L) 10 - 30 ug/mL  Ethanol  Result Value Ref Range   Alcohol, Ethyl (B) <5 <5 mg/dL  Salicylate level  Result Value Ref Range   Salicylate Lvl  <4.0 2.8 - 30.0 mg/dL  Urine rapid drug screen (hosp performed)  Result Value Ref Range   Opiates POSITIVE (A) NONE DETECTED   Cocaine NONE DETECTED NONE DETECTED   Benzodiazepines POSITIVE (A) NONE DETECTED   Amphetamines NONE DETECTED NONE DETECTED   Tetrahydrocannabinol POSITIVE (A) NONE DETECTED   Barbiturates NONE DETECTED NONE DETECTED  Urinalysis, Routine w reflex microscopic  Result Value Ref Range   Color, Urine AMBER (A) YELLOW   APPearance CLEAR CLEAR   Specific Gravity, Urine 1.025 1.005 - 1.030   pH 6.0 5.0 - 8.0   Glucose, UA NEGATIVE NEGATIVE mg/dL   Hgb urine dipstick NEGATIVE NEGATIVE   Bilirubin Urine MODERATE (A) NEGATIVE   Ketones, ur TRACE (A) NEGATIVE mg/dL   Protein, ur 213100 (A)  NEGATIVE mg/dL   Nitrite POSITIVE (A) NEGATIVE   Leukocytes, UA NEGATIVE NEGATIVE  Urine microscopic-add on  Result Value Ref Range   Squamous Epithelial / LPF 0-5 (A) NONE SEEN   WBC, UA 0-5 0 - 5 WBC/hpf   RBC / HPF NONE SEEN 0 - 5 RBC/hpf   Bacteria, UA FEW (A) NONE SEEN   Crystals CA OXALATE CRYSTALS (A) NEGATIVE   Sperm, UA PRESENT    Urine-Other MUCOUS PRESENT   CBG monitoring, ED  Result Value Ref Range   Glucose-Capillary 106 (H) 65 - 99 mg/dL   Dg Chest 2 View 6/57/8469  CLINICAL DATA:  Altered mental status for 3 days EXAM: CHEST  2 VIEW COMPARISON:  None. FINDINGS: The heart size and mediastinal contours are within normal limits. Both lungs are clear. The visualized skeletal structures are unremarkable. IMPRESSION: No active cardiopulmonary disease. Electronically Signed   By: Genevive Bi M.D.   On: 03/16/2016 16:34   Ct Head Wo Contrast 03/16/2016  CLINICAL DATA:  Decreased energy and generalize weakness for 2 days. Heroin usage EXAM: CT HEAD WITHOUT CONTRAST TECHNIQUE: Contiguous axial images were obtained from the base of the skull through the vertex without intravenous contrast. COMPARISON:  None. FINDINGS: No acute cortical infarct, hemorrhage, or mass lesion  ispresent. Ventricles are of normal size. No significant extra-axial fluid collection is present. The paranasal sinuses andmastoid air cells are clear. The osseous skull is intact. IMPRESSION: Normal brain. Electronically Signed   By: Signa Kell M.D.   On: 03/16/2016 18:06    1830:  No clear UTI on Udip; UC pending. Workup reassuring. Pt often refusing to speak to ED staff, laying eyes 1/2 closed. Narcan ordered. When informed of this, pt immediately sat up, eyes wide open, agitated and swearing at staff and his mother in a loud clear voice ("don't give me that fucking narcan because I'll go into withdrawals") and then answered ED staff's questions in a clear voice, A&O. Pt's mother insistent pt is "in withdrawals now;" reassured pt clinically was not. Pt told his mother of the other substances found in his UDS; mother's multiple questions answered (ie: additive sedative effects of heroin and benzos). Mother would like to take child home now. Will tx for cellulitis with clindamycin; f/u PMD, return precautions given. Dx and testing d/w pt and family.  Questions answered.  Verb understanding, agreeable to d/c home with outpt f/u.   Samuel Jester, DO 03/19/16 [phone]

## 2016-03-16 NOTE — ED Notes (Signed)
Pt refuses Narcan. MD notified.

## 2016-03-18 LAB — URINE CULTURE: Culture: 1000 — AB

## 2016-11-28 IMAGING — DX DG CHEST 2V
2 series · 2 of 2 positions shown · non-contrast
Comparison: None.

CLINICAL DATA: Altered mental status for 3 days

EXAM:
CHEST  2 VIEW

[chest pa]
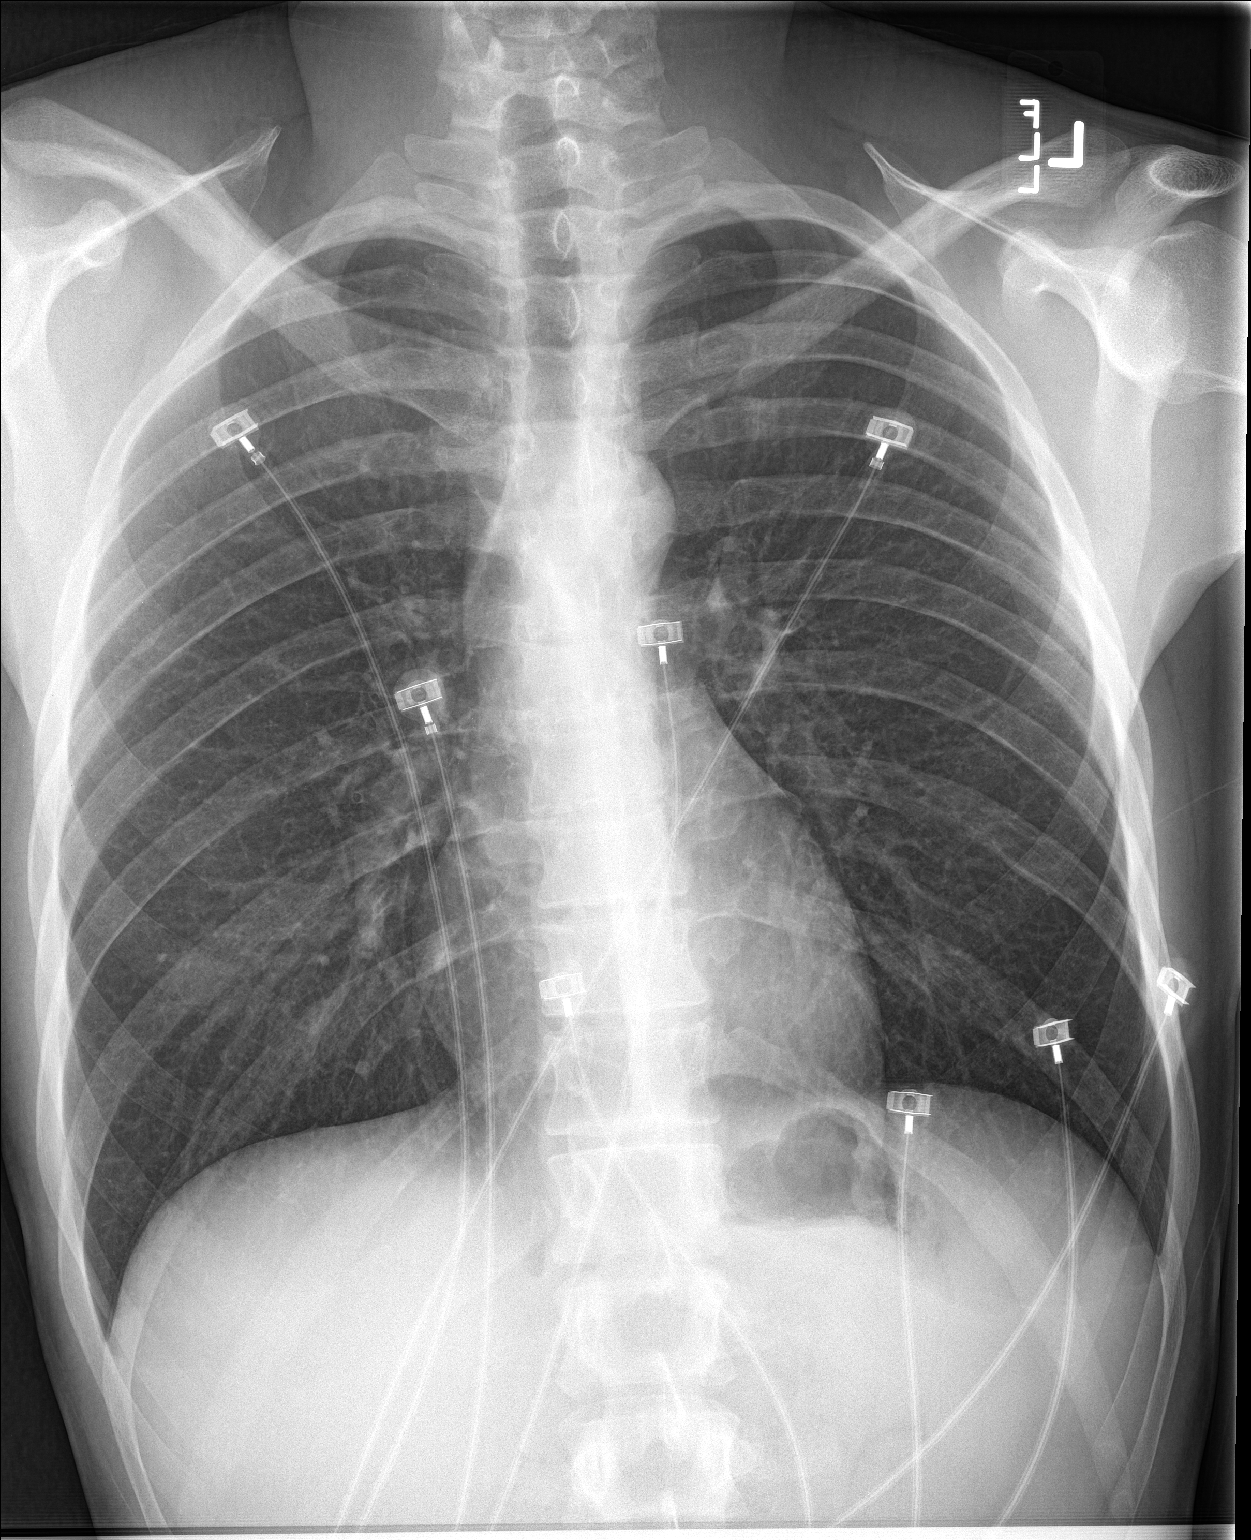

[chest lat]
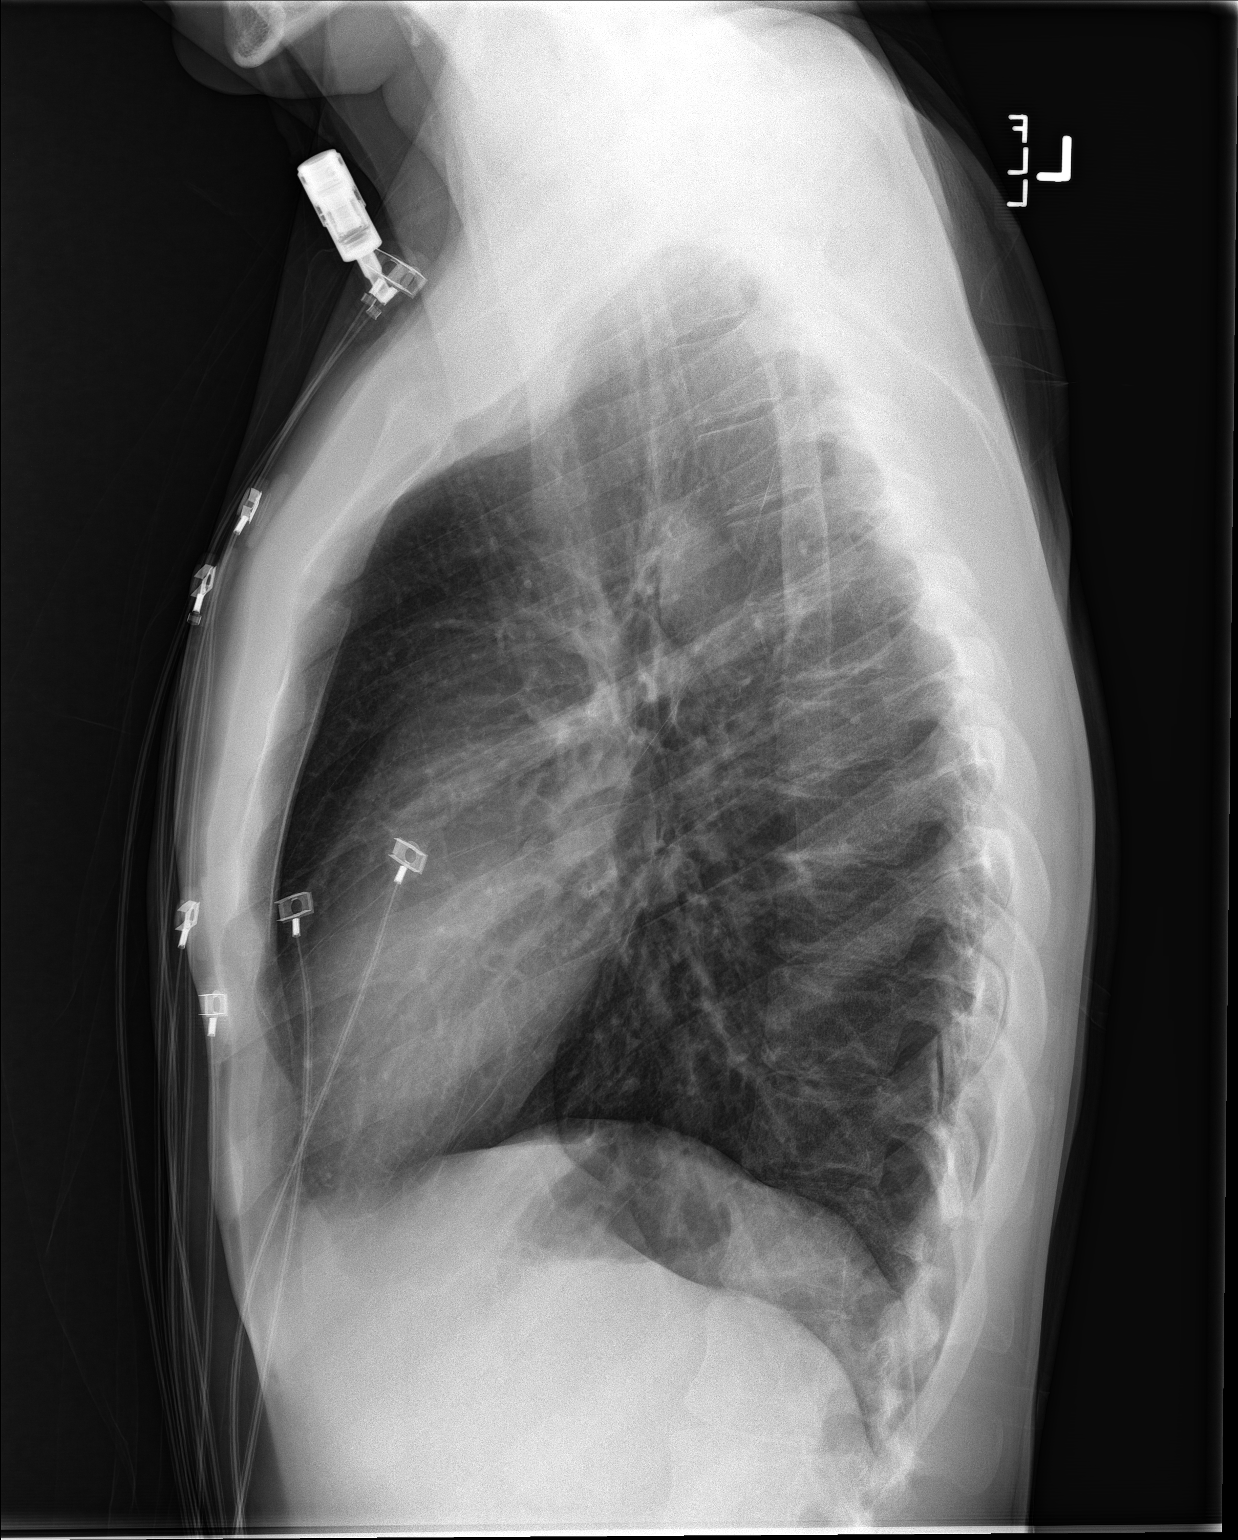

[2 of 2 positions shown; findings below may reference images not displayed]

FINDINGS: The heart size and mediastinal contours are within normal limits.
Both lungs are clear. The visualized skeletal structures are
unremarkable.
IMPRESSION: No active cardiopulmonary disease.

## 2016-11-28 IMAGING — CT CT HEAD W/O CM
1 series · 16 of 30 positions shown, 20 images · non-contrast
Comparison: None.

CLINICAL DATA: Decreased energy and generalize weakness for 2 days.
Heroin usage

EXAM:
CT HEAD WITHOUT CONTRAST
TECHNIQUE: Contiguous axial images were obtained from the base of the skull
through the vertex without intravenous contrast.

[Series 2: headtrauma 4.8 h37s · axial · 0.42mm/px · z∈[+275,+427]mm · 16 of 36 slices shown, 20 images]
[im 2/36  brain]
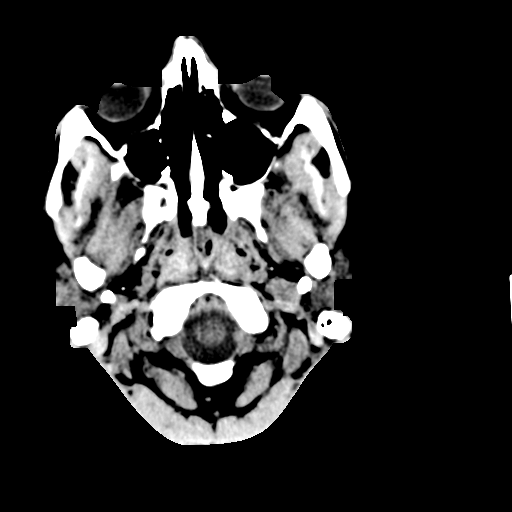
[im 2/36  bone]
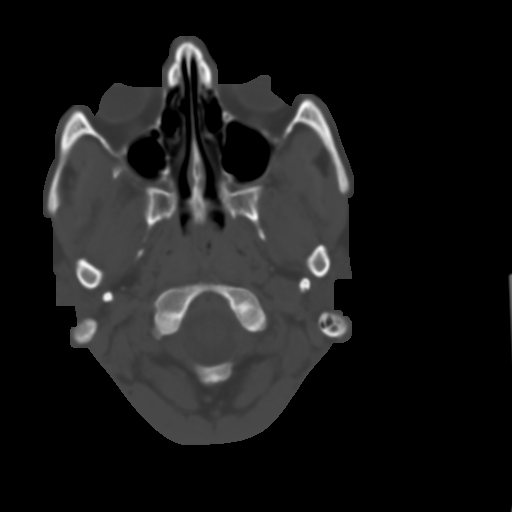
[im 4/36  brain]
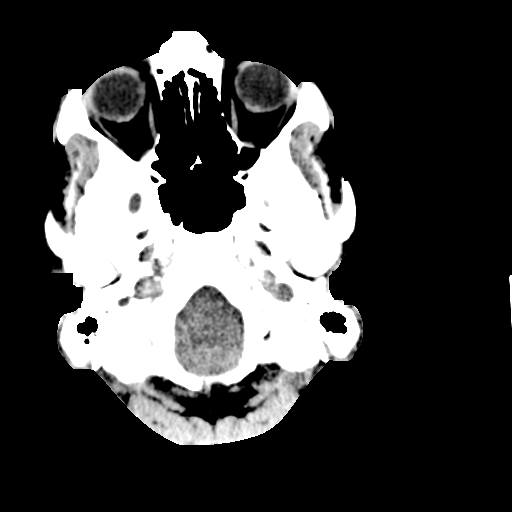
[im 7/36  brain]
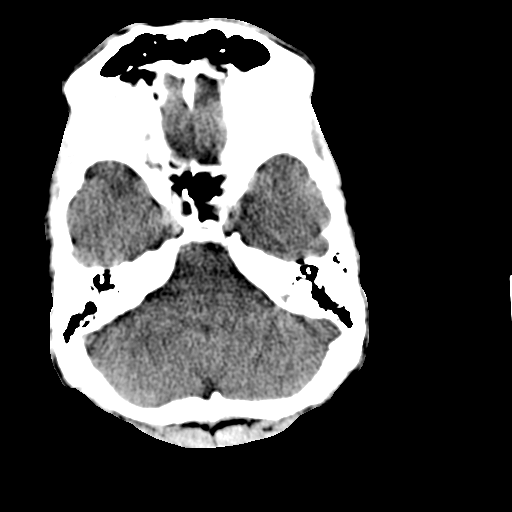
[im 9/36  brain]
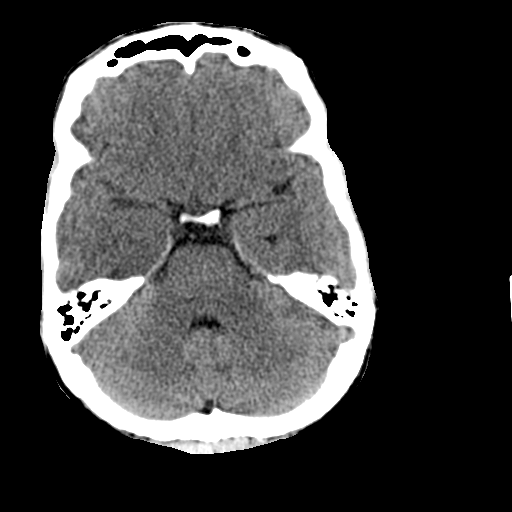
[im 10/36  brain]
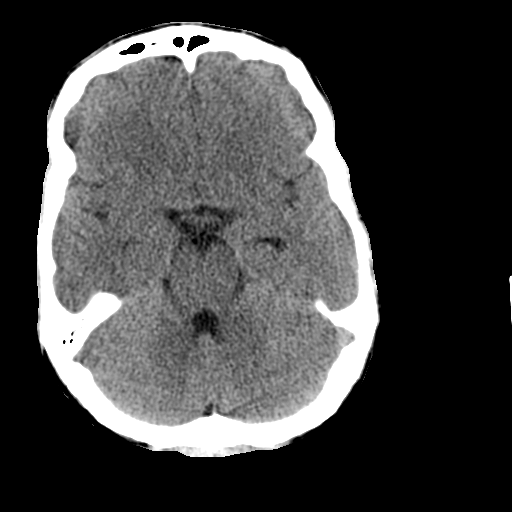
[im 10/36  bone]
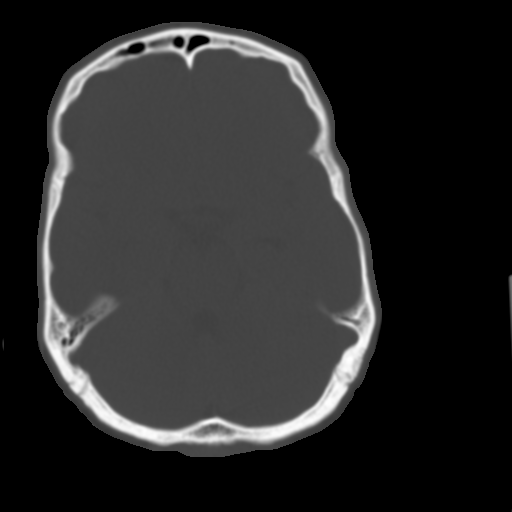
[im 13/36  brain]
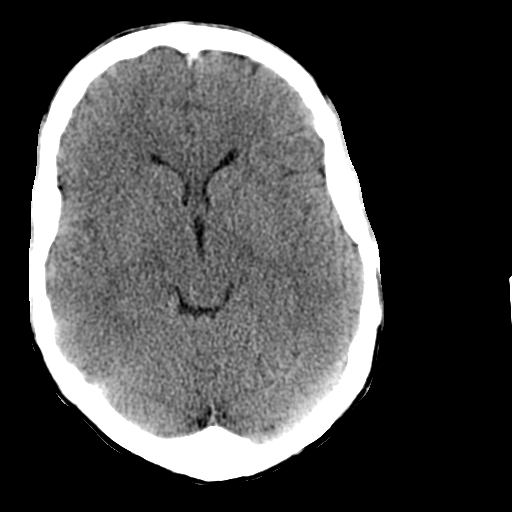
[im 15/36  brain]
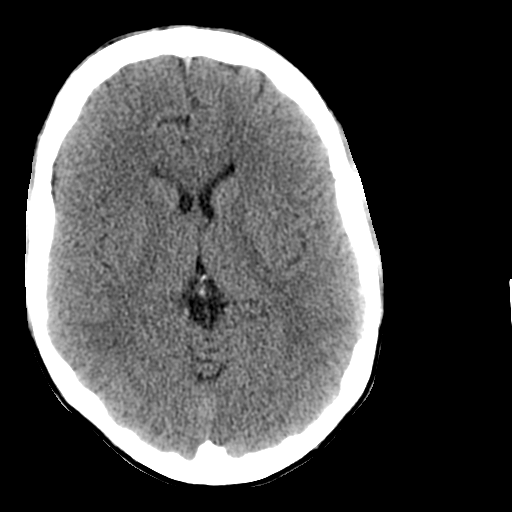
[im 17/36  brain]
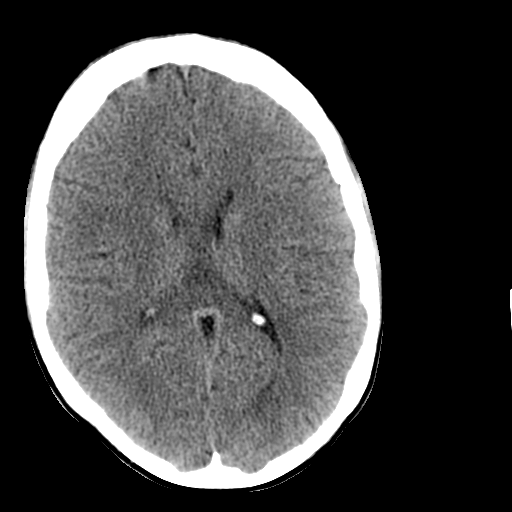
[im 19/36  brain]
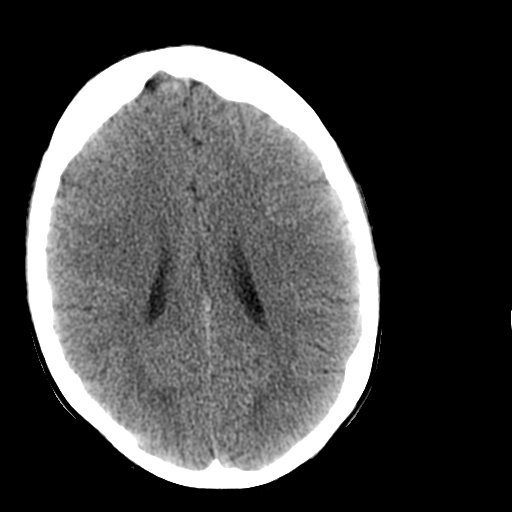
[im 19/36  bone]
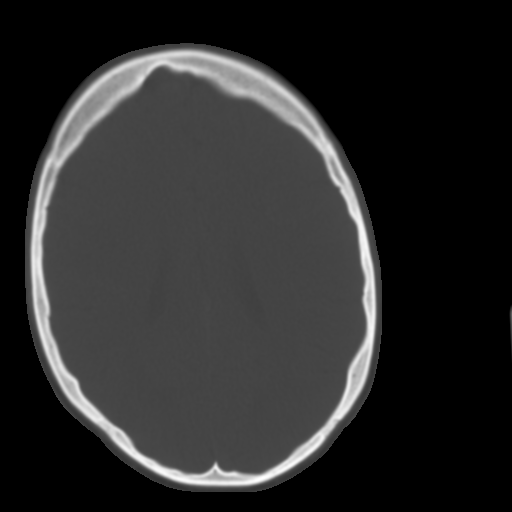
[im 21/36  brain]
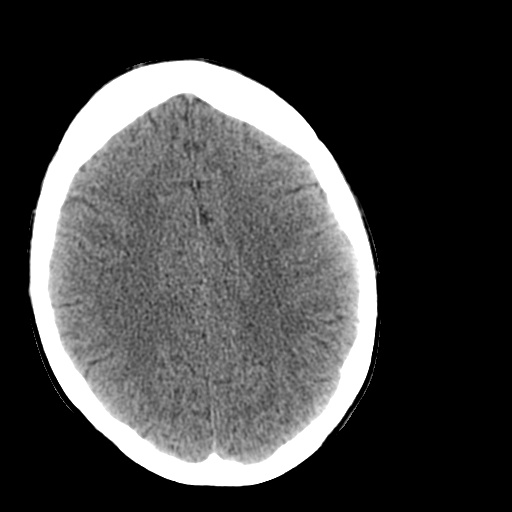
[im 23/36  brain]
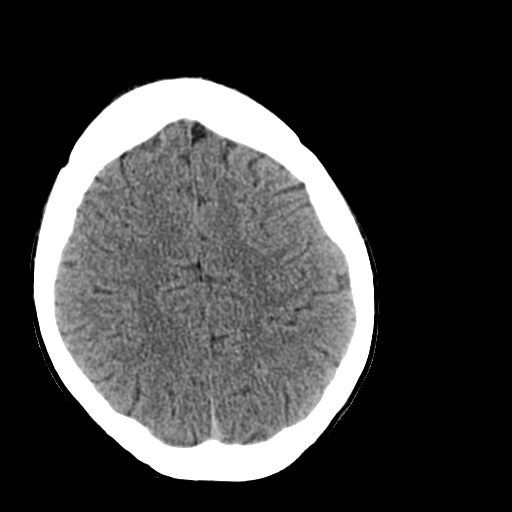
[im 26/36  brain]
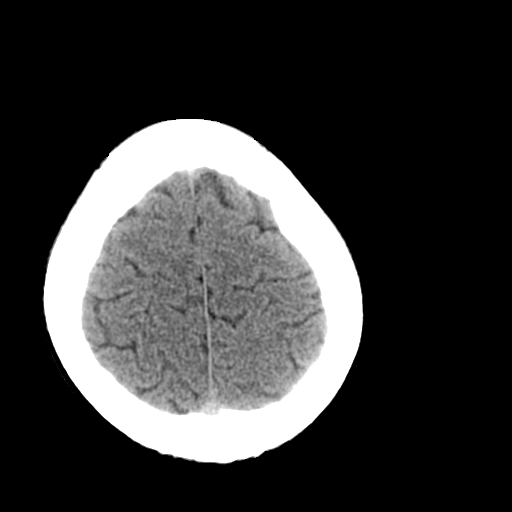
[im 27/36  brain]
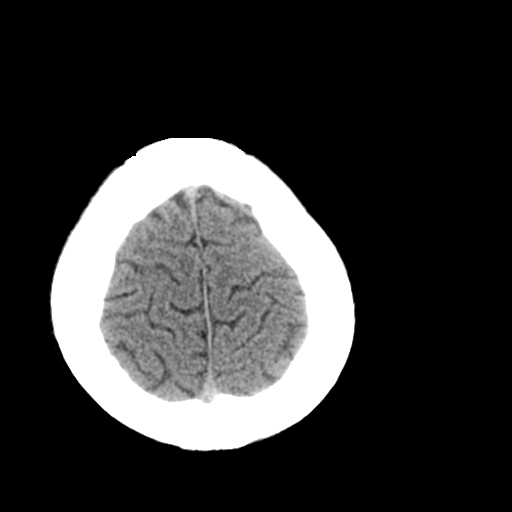
[im 27/36  bone]
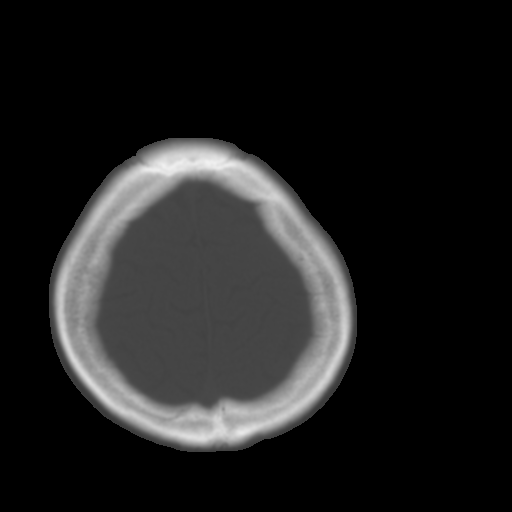
[im 29/36  brain]
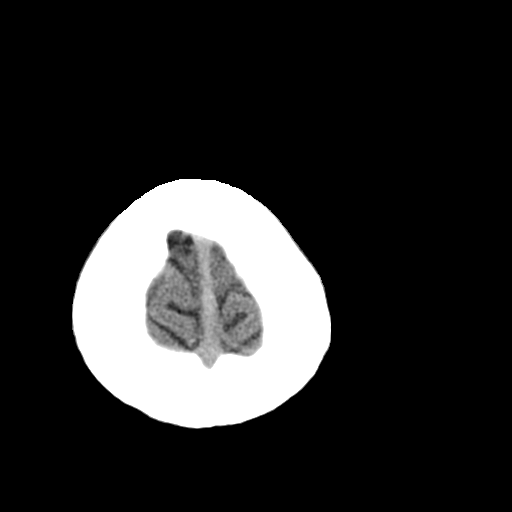
[im 32/36  brain]
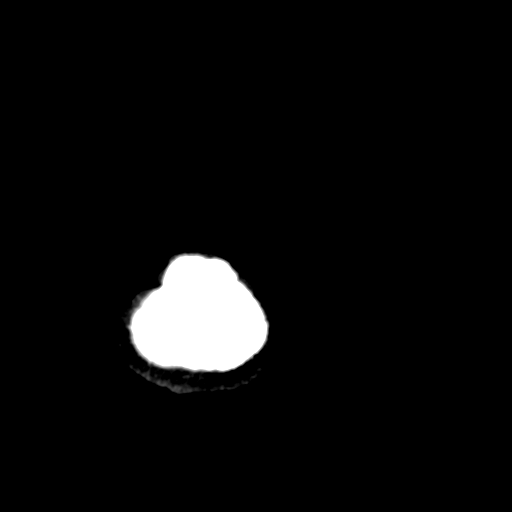
[im 34/36  brain]
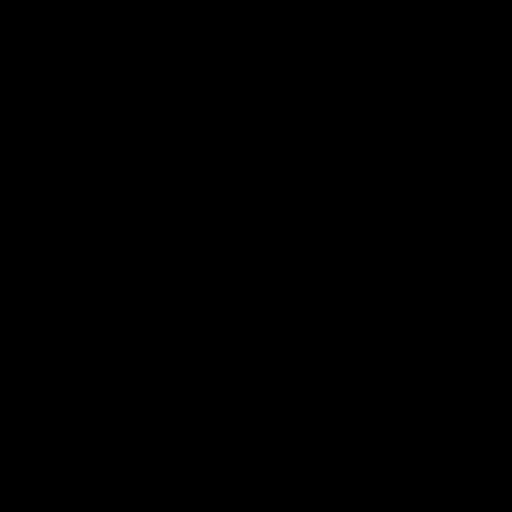

[16 of 30 positions shown; findings below may reference images not displayed]

FINDINGS: No acute cortical infarct, hemorrhage, or mass lesion ispresent.
Ventricles are of normal size. No significant extra-axial fluid
collection is present. The paranasal sinuses andmastoid air cells
are clear. The osseous skull is intact.
IMPRESSION: Normal brain.

## 2018-10-27 ENCOUNTER — Other Ambulatory Visit: Payer: Self-pay

## 2018-10-27 ENCOUNTER — Emergency Department (HOSPITAL_COMMUNITY)
Admission: EM | Admit: 2018-10-27 | Discharge: 2018-10-27 | Disposition: A | Discharge status: Home / Self Care | Payer: BLUE CROSS/BLUE SHIELD | Attending: Emergency Medicine | Admitting: Emergency Medicine

## 2018-10-27 DIAGNOSIS — R471 Dysarthria and anarthria: Secondary | ICD-10-CM | POA: Insufficient documentation

## 2018-10-27 DIAGNOSIS — F17210 Nicotine dependence, cigarettes, uncomplicated: Secondary | ICD-10-CM | POA: Insufficient documentation

## 2018-10-27 DIAGNOSIS — F1110 Opioid abuse, uncomplicated: Secondary | ICD-10-CM | POA: Insufficient documentation

## 2018-10-27 DIAGNOSIS — F1910 Other psychoactive substance abuse, uncomplicated: Secondary | ICD-10-CM | POA: Insufficient documentation

## 2018-10-27 LAB — RAPID URINE DRUG SCREEN, HOSP PERFORMED
AMPHETAMINES: NOT DETECTED
BARBITURATES: NOT DETECTED
Benzodiazepines: POSITIVE — AB
COCAINE: NOT DETECTED
Opiates: POSITIVE — AB
TETRAHYDROCANNABINOL: POSITIVE — AB

## 2018-10-27 NOTE — Discharge Instructions (Addendum)
Your drug use is obviously causing your problems.  You should stop using drugs immediately.  Consider going to a treatment center and/or see a therapist, to help you avoid using drugs.  We have attached information to this document to help you find services which can help you stay away from drugs.

## 2018-10-27 NOTE — ED Triage Notes (Signed)
Pt's mother took out IVC papers for him using herion and xanax and she thinks he is a risk to himself. Pt denies SI/HI
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
## 2019-08-13 ENCOUNTER — Encounter (HOSPITAL_COMMUNITY): Payer: Self-pay

## 2019-08-13 ENCOUNTER — Emergency Department (HOSPITAL_COMMUNITY)
Admission: EM | Admit: 2019-08-13 | Discharge: 2019-08-13 | Discharge status: Against Medical Advice | Payer: Self-pay | Attending: Emergency Medicine | Admitting: Emergency Medicine

## 2019-08-13 ENCOUNTER — Other Ambulatory Visit: Payer: Self-pay

## 2019-08-13 DIAGNOSIS — Z5321 Procedure and treatment not carried out due to patient leaving prior to being seen by health care provider: Secondary | ICD-10-CM | POA: Insufficient documentation

## 2019-08-13 LAB — CBC
HCT: 47.6 % (ref 39.0–52.0)
Hemoglobin: 16.6 g/dL (ref 13.0–17.0)
MCH: 30.6 pg (ref 26.0–34.0)
MCHC: 34.9 g/dL (ref 30.0–36.0)
MCV: 87.7 fL (ref 80.0–100.0)
Platelets: 232 10*3/uL (ref 150–400)
RBC: 5.43 MIL/uL (ref 4.22–5.81)
RDW: 12.9 % (ref 11.5–15.5)
WBC: 16 10*3/uL — ABNORMAL HIGH (ref 4.0–10.5)
nRBC: 0 % (ref 0.0–0.2)

## 2019-08-13 LAB — COMPREHENSIVE METABOLIC PANEL
ALT: 22 U/L (ref 0–44)
AST: 47 U/L — ABNORMAL HIGH (ref 15–41)
Albumin: 4.9 g/dL (ref 3.5–5.0)
Alkaline Phosphatase: 58 U/L (ref 38–126)
Anion gap: 23 — ABNORMAL HIGH (ref 5–15)
BUN: 11 mg/dL (ref 6–20)
CO2: 13 mmol/L — ABNORMAL LOW (ref 22–32)
Calcium: 9.6 mg/dL (ref 8.9–10.3)
Chloride: 104 mmol/L (ref 98–111)
Creatinine, Ser: 1.38 mg/dL — ABNORMAL HIGH (ref 0.61–1.24)
GFR calc Af Amer: >60 mL/min (ref >60)
GFR calc non Af Amer: >60 mL/min (ref >60)
Glucose, Bld: 97 mg/dL (ref 70–99)
Potassium: 4 mmol/L (ref 3.5–5.1)
Sodium: 140 mmol/L (ref 135–145)
Total Bilirubin: 1.4 mg/dL — ABNORMAL HIGH (ref 0.3–1.2)
Total Protein: 7.9 g/dL (ref 6.5–8.1)

## 2019-08-13 LAB — CK: Total CK: 158 U/L (ref 49–397)

## 2019-08-13 MED ORDER — IBUPROFEN 800 MG PO TABS
800.0000 mg | ORAL_TABLET | Freq: Once | ORAL | Status: DC
  Filled 2019-08-13: qty 2

## 2019-08-13 MED ORDER — METHOCARBAMOL 500 MG PO TABS
500.0000 mg | ORAL_TABLET | Freq: Once | ORAL | Status: DC
  Filled 2019-08-13: qty 1

## 2019-08-13 MED ORDER — DICYCLOMINE HCL 10 MG PO CAPS
10.0000 mg | ORAL_CAPSULE | Freq: Once | ORAL | Status: DC
  Filled 2019-08-13: qty 1

## 2019-08-13 NOTE — ED Notes (Signed)
Pt stated he was leaving, seen rolled out by mother

## 2019-08-13 NOTE — ED Triage Notes (Signed)
Pt arrives POV c.o full body muscle spasms since yesterday. Pt states he last used heroin on Monday night. Pt alert

## 2019-08-17 ENCOUNTER — Encounter (HOSPITAL_COMMUNITY): Payer: Self-pay | Admitting: Emergency Medicine

## 2019-08-17 ENCOUNTER — Emergency Department (HOSPITAL_COMMUNITY)
Admission: EM | Admit: 2019-08-17 | Discharge: 2019-08-17 | Disposition: A | Discharge status: Home / Self Care | Payer: Self-pay | Attending: Emergency Medicine | Admitting: Emergency Medicine

## 2019-08-17 DIAGNOSIS — Z5321 Procedure and treatment not carried out due to patient leaving prior to being seen by health care provider: Secondary | ICD-10-CM | POA: Insufficient documentation

## 2019-08-17 NOTE — ED Triage Notes (Signed)
Per mother/patient, states he is coming off Heroin, Meth and Xanax-states last use was last Tuesday-went to [facility] for same symptoms on 10/14-mother states he is having a "spychosis"-patient states he is just having weird dreams-states they talked to [facility] and they told him to come here for medical clearance

## 2019-09-30 ENCOUNTER — Other Ambulatory Visit: Payer: Self-pay | Admitting: *Deleted

## 2019-09-30 DIAGNOSIS — Z20822 Contact with and (suspected) exposure to covid-19: Secondary | ICD-10-CM

## 2019-10-02 ENCOUNTER — Telehealth: Payer: Self-pay | Admitting: *Deleted

## 2019-10-02 LAB — NOVEL CORONAVIRUS, NAA: SARS-CoV-2, NAA: NOT DETECTED

## 2019-10-02 NOTE — Telephone Encounter (Signed)
Pt called for COVID test result from 09/30/2019; explained that results are not ready, and the turn around time is based on the number of tests that must be completed; also informed pt results are available first in MyChart, and he will receive a phone call; she was encouraged to answer all calls; the pt  verbalized understanding.

## 2019-10-26 ENCOUNTER — Encounter (HOSPITAL_COMMUNITY): Payer: Self-pay | Admitting: Emergency Medicine

## 2019-10-26 ENCOUNTER — Emergency Department (HOSPITAL_COMMUNITY)
Admission: EM | Admit: 2019-10-26 | Discharge: 2019-10-26 | Disposition: A | Discharge status: Home / Self Care | Payer: Medicaid Other | Attending: Emergency Medicine | Admitting: Emergency Medicine

## 2019-10-26 ENCOUNTER — Other Ambulatory Visit: Payer: Self-pay

## 2019-10-26 DIAGNOSIS — F17200 Nicotine dependence, unspecified, uncomplicated: Secondary | ICD-10-CM | POA: Insufficient documentation

## 2019-10-26 DIAGNOSIS — Z79899 Other long term (current) drug therapy: Secondary | ICD-10-CM | POA: Insufficient documentation

## 2019-10-26 DIAGNOSIS — T507X1A Poisoning by analeptics and opioid receptor antagonists, accidental (unintentional), initial encounter: Secondary | ICD-10-CM | POA: Insufficient documentation

## 2019-10-26 DIAGNOSIS — T40601A Poisoning by unspecified narcotics, accidental (unintentional), initial encounter: Secondary | ICD-10-CM

## 2019-10-26 LAB — COMPREHENSIVE METABOLIC PANEL
ALT: 14 U/L (ref 0–44)
AST: 15 U/L (ref 15–41)
Albumin: 4.7 g/dL (ref 3.5–5.0)
Alkaline Phosphatase: 71 U/L (ref 38–126)
Anion gap: 10 (ref 5–15)
BUN: 9 mg/dL (ref 6–20)
CO2: 24 mmol/L (ref 22–32)
Calcium: 9.3 mg/dL (ref 8.9–10.3)
Chloride: 106 mmol/L (ref 98–111)
Creatinine, Ser: 1 mg/dL (ref 0.61–1.24)
GFR calc Af Amer: >60 mL/min (ref >60)
GFR calc non Af Amer: >60 mL/min (ref >60)
Glucose, Bld: 152 mg/dL — ABNORMAL HIGH (ref 70–99)
Potassium: 4.4 mmol/L (ref 3.5–5.1)
Sodium: 140 mmol/L (ref 135–145)
Total Bilirubin: 0.4 mg/dL (ref 0.3–1.2)
Total Protein: 7.7 g/dL (ref 6.5–8.1)

## 2019-10-26 LAB — CBC
HCT: 51.5 % (ref 39.0–52.0)
Hemoglobin: 17.6 g/dL — ABNORMAL HIGH (ref 13.0–17.0)
MCH: 30.1 pg (ref 26.0–34.0)
MCHC: 34.2 g/dL (ref 30.0–36.0)
MCV: 88 fL (ref 80.0–100.0)
Platelets: 250 10*3/uL (ref 150–400)
RBC: 5.85 MIL/uL — ABNORMAL HIGH (ref 4.22–5.81)
RDW: 12.7 % (ref 11.5–15.5)
WBC: 15.7 10*3/uL — ABNORMAL HIGH (ref 4.0–10.5)
nRBC: 0 % (ref 0.0–0.2)

## 2019-10-26 LAB — ACETAMINOPHEN LEVEL: Acetaminophen (Tylenol), Serum: <10 ug/mL — ABNORMAL LOW (ref 10–30)

## 2019-10-26 LAB — ETHANOL: Alcohol, Ethyl (B): <10 mg/dL (ref <10)

## 2019-10-26 LAB — SALICYLATE LEVEL: Salicylate Lvl: <7 mg/dL — ABNORMAL LOW (ref 7.0–30.0)

## 2019-10-26 MED ORDER — ONDANSETRON HCL 4 MG/2ML IJ SOLN
4.0000 mg | Freq: Once | INTRAMUSCULAR | Status: AC
  Administered 2019-10-26: 23:00:00 4 mg via INTRAVENOUS
  Filled 2019-10-26: qty 2

## 2019-10-26 NOTE — ED Provider Notes (Signed)
[facility] EMERGENCY DEPARTMENT Provider Note   CSN: [id number] Arrival date & time: 10/26/19  1957     History No chief complaint on file.   Dennis Welch is a 23 y.o. male.  23 year old male with past medical history including polysubstance abuse, ADD who presents with possible overdose.  Patient was reportedly found unresponsive at home this evening and EMS was called.  EMS noted that he was lethargic on initial arrival.  They gave him 4 mg intranasal Narcan without much response and gave him 4 mg IV.  They report his pupils were initially pinpoint but improved after second dose of Narcan and patient's mental status seemed to improve.  Patient has had a conflicting story and it is unclear what he actually took but he admits to taking 6-8 hydrocodone in an effort to improve his mouth pain from having dental extractions and veneers placed on 10/22/2019.  He admits to smoking weed but he denies any alcohol use, heroin use, or other drug use.  He states that he has been clean from heroin for 6 months.  He denies any intention for self-harm, states that he just wanted to improve his pain.  LEVEL 5 CAVEAT DUE TO AMS  The history is provided by the patient and the EMS personnel.       Past Medical History:  Diagnosis Date  . ADD (attention deficit disorder with hyperactivity)   . Heroin abuse (HCC)   . Polysubstance abuse (HCC)   . Vomiting     Patient Active Problem List   Diagnosis Date Noted  . Aspiration pneumonia of right upper lobe (HCC)   . Agitation   . Overdose 08/02/2015  . Heroin overdose (HCC) 08/02/2015  . Aspiration pneumonia (HCC) 08/02/2015  . ADD (attention deficit disorder) 02/03/2013    Past Surgical History:  Procedure Laterality Date  . WISDOM TOOTH EXTRACTION         Family History  Problem Relation Age of Onset  . Healthy Mother   . Healthy Father   . Healthy Brother     Social History   Tobacco Use  . Smoking status:  Current Every Day Smoker    Packs/day: 1.00    Years: 4.00    Pack years: 4.00    Types: Cigarettes  Substance Use Topics  . Alcohol use: No  . Drug use: Yes    Types: Benzodiazepines, Heroin, Marijuana, IV    Comment: heroin, benzos    Home Medications Prior to Admission medications   Medication Sig Start Date End Date Taking? Authorizing Provider  HYDROcodone-acetaminophen (NORCO) 10-325 MG tablet Take 1 tablet by mouth daily.   Yes [provider]  OLANZapine zydis (ZYPREXA) 5 MG disintegrating tablet Take 5 mg by mouth at bedtime. 08/22/19  Yes [provider]    Allergies    Sulfa antibiotics  Review of Systems   Review of Systems  Unable to perform ROS: Mental status change    Physical Exam Updated Vital Signs BP 128/82   Pulse 85   Resp 17   Ht 5\' 10"  (1.778 m)   Wt 65.8 kg   SpO2 95%   BMI 20.81 kg/m   Physical Exam Vitals and nursing note reviewed.  Constitutional:      General: He is not in acute distress.    Appearance: He is well-developed.     Comments: Sleepy but awake  HENT:     Head: Normocephalic and atraumatic.     Mouth/Throat:  Comments: Veneers in place Eyes:     Conjunctiva/sclera: Conjunctivae normal.  Cardiovascular:     Rate and Rhythm: Normal rate and regular rhythm.     Heart sounds: Normal heart sounds. No murmur.  Pulmonary:     Effort: Pulmonary effort is normal.     Breath sounds: Normal breath sounds.  Abdominal:     General: Bowel sounds are normal. There is no distension.     Palpations: Abdomen is soft.     Tenderness: There is no abdominal tenderness.  Musculoskeletal:     Cervical back: Neck supple.  Skin:    General: Skin is warm and dry.  Neurological:     Comments: Mildly disoriented but  Fluent speech, following commands  Psychiatric:     Comments: Slowed speech     ED Results / Procedures / Treatments   Labs (all labs ordered are listed, but only abnormal results are  displayed) Labs Reviewed  ACETAMINOPHEN LEVEL - Abnormal; Notable for the following components:      Result Value   Acetaminophen (Tylenol), Serum <10 (*)    All other components within normal limits  COMPREHENSIVE METABOLIC PANEL - Abnormal; Notable for the following components:   Glucose, Bld 152 (*)    All other components within normal limits  SALICYLATE LEVEL - Abnormal; Notable for the following components:   Salicylate Lvl <9.1 (*)    All other components within normal limits  CBC - Abnormal; Notable for the following components:   WBC 15.7 (*)    RBC 5.85 (*)    Hemoglobin 17.6 (*)    All other components within normal limits  ETHANOL  RAPID URINE DRUG SCREEN, HOSP PERFORMED    EKG EKG Interpretation  Date/Time:  Sunday October 26 2019 20:07:37 EST Ventricular Rate:  102 PR Interval:    QRS Duration: 91 QT Interval:  325 QTC Calculation: 424 R Axis:   73 Text Interpretation: Sinus tachycardia Borderline T wave abnormalities No significant change since last tracing Confirmed by Theotis Burrow [phone]) on 10/26/2019 8:13:19 PM   Radiology No results found.  Procedures Procedures (including critical care time)  Medications Ordered in ED Medications - No data to display  ED Course  I have reviewed the triage vital signs and the nursing notes.  Pertinent labs & imaging results that were available during my care of the patient were reviewed by me and considered in my medical decision making (see chart for details).    MDM Rules/Calculators/A&P                      Pt awake and protecting airway on arrival, mild disorientation but otherwise neurologically intact. Denies any intention of self-harm. Labs show negative tylenol, ASA, and alcohol levels, reassuring CMP and CBC.  Patient is sleepy on reassessment but arousable by voice and has been drinking water in the ED with no problems.  He has been observed for almost 3 hours since arrival.  I have emphasized the  importance of taking pain medication only as prescribed.  With his permission, I discussed his work-up findings with mother.  Final Clinical Impression(s) / ED Diagnoses Final diagnoses:  Overdose of opiate or related narcotic, accidental or unintentional, initial encounter [facility])    Rx / DC Orders ED Discharge Orders    None       Deavion Dobbs, Wenda Overland, MD 10/26/19 2242

## 2019-10-26 NOTE — ED Notes (Signed)
PT dressed and wheeled out to lobby by RN

## 2019-10-26 NOTE — ED Notes (Signed)
Pt difficult stick. Stated it took 6 times for them to get an IV when he had surgery on the 22nd. Phlebotomy consulted to help with blood collection

## 2019-10-26 NOTE — ED Triage Notes (Signed)
Pt coming from home after being found unresponsive. Awake but lethargic with EMS. Recently had mouth surgery and was prescribed hydrocodone. Pt thinks he may have taken too many of them but denies any heroin/substance abuse otherwise. Pt has conflicting story about saying he is out of his pain medication but then stated he took "6-8  Hydrocodone" earlier. Given 8mg  Narcan, 4mg  Nasal, 4mg  IV. BP 142/91, 100% 4L [place], HR 96, RR 20.

## 2019-10-26 NOTE — ED Notes (Signed)
Pt oriented but lethargic. Continuously sneezing. Complaints of mouth pain but otherwise pt states he is just sleepy

## 2019-10-26 NOTE — ED Notes (Signed)
Pt's mother called to pick up patient. Said she would be about 30 minutes

## 2019-10-26 NOTE — ED Notes (Signed)
Patient mother Dennis Welch calling asking for update on patient please call back when we can  [phone]

## 2020-06-30 DEATH — deceased

## 2023-04-13 ENCOUNTER — Telehealth (HOSPITAL_COMMUNITY): Payer: Self-pay | Admitting: Licensed Clinical Social Worker

## 2023-04-16 NOTE — BH Specialist Note (Signed)
Note in error.
